# Patient Record
Sex: Female | Born: 1975 | Race: Black or African American | Hispanic: No | Marital: Married | Smoking: Never smoker
Health system: Southern US, Community
[De-identification: ages and names within clinical notes are randomized; demographics above are authoritative.]

## PROBLEM LIST (undated history)

## (undated) DIAGNOSIS — D219 Benign neoplasm of connective and other soft tissue, unspecified: Secondary | ICD-10-CM

## (undated) DIAGNOSIS — N809 Endometriosis, unspecified: Secondary | ICD-10-CM

## (undated) DIAGNOSIS — K56600 Partial intestinal obstruction, unspecified as to cause: Secondary | ICD-10-CM

## (undated) DIAGNOSIS — K9189 Other postprocedural complications and disorders of digestive system: Secondary | ICD-10-CM

## (undated) DIAGNOSIS — K567 Ileus, unspecified: Secondary | ICD-10-CM

## (undated) HISTORY — DX: Benign neoplasm of connective and other soft tissue, unspecified: D21.9

## (undated) HISTORY — DX: Endometriosis, unspecified: N80.9

## (undated) HISTORY — PX: LAPAROSCOPIC ABDOMINAL EXPLORATION: SHX6249

---

## 1998-02-11 ENCOUNTER — Emergency Department (HOSPITAL_COMMUNITY): Admission: EM | Admit: 1998-02-11 | Discharge: 1998-02-11 | Payer: Self-pay | Admitting: Emergency Medicine

## 1998-03-10 ENCOUNTER — Emergency Department (HOSPITAL_COMMUNITY): Admission: EM | Admit: 1998-03-10 | Discharge: 1998-03-10 | Payer: Self-pay | Admitting: Emergency Medicine

## 1999-03-30 ENCOUNTER — Emergency Department (HOSPITAL_COMMUNITY): Admission: EM | Admit: 1999-03-30 | Discharge: 1999-03-30 | Payer: Self-pay | Admitting: Emergency Medicine

## 1999-06-18 ENCOUNTER — Emergency Department (HOSPITAL_COMMUNITY): Admission: EM | Admit: 1999-06-18 | Discharge: 1999-06-18 | Payer: Self-pay

## 1999-12-09 ENCOUNTER — Encounter: Payer: Self-pay | Admitting: Emergency Medicine

## 1999-12-09 ENCOUNTER — Emergency Department (HOSPITAL_COMMUNITY): Admission: EM | Admit: 1999-12-09 | Discharge: 1999-12-09 | Payer: Self-pay | Admitting: Emergency Medicine

## 2001-03-08 ENCOUNTER — Emergency Department (HOSPITAL_COMMUNITY): Admission: EM | Admit: 2001-03-08 | Discharge: 2001-03-08 | Payer: Self-pay | Admitting: Emergency Medicine

## 2001-07-25 ENCOUNTER — Emergency Department (HOSPITAL_COMMUNITY): Admission: EM | Admit: 2001-07-25 | Discharge: 2001-07-25 | Payer: Self-pay | Admitting: Emergency Medicine

## 2001-07-26 ENCOUNTER — Encounter: Payer: Self-pay | Admitting: Emergency Medicine

## 2001-07-26 ENCOUNTER — Emergency Department (HOSPITAL_COMMUNITY): Admission: EM | Admit: 2001-07-26 | Discharge: 2001-07-26 | Payer: Self-pay | Admitting: Emergency Medicine

## 2001-09-29 ENCOUNTER — Emergency Department (HOSPITAL_COMMUNITY): Admission: EM | Admit: 2001-09-29 | Discharge: 2001-09-29 | Payer: Self-pay | Admitting: Emergency Medicine

## 2001-10-11 ENCOUNTER — Encounter: Admission: RE | Admit: 2001-10-11 | Discharge: 2001-11-02 | Payer: Self-pay | Admitting: Occupational Medicine

## 2002-01-12 ENCOUNTER — Emergency Department (HOSPITAL_COMMUNITY): Admission: EM | Admit: 2002-01-12 | Discharge: 2002-01-13 | Payer: Self-pay

## 2002-01-13 ENCOUNTER — Encounter: Payer: Self-pay | Admitting: Emergency Medicine

## 2002-01-18 ENCOUNTER — Encounter: Admission: RE | Admit: 2002-01-18 | Discharge: 2002-01-18 | Payer: Self-pay | Admitting: Internal Medicine

## 2002-01-19 ENCOUNTER — Ambulatory Visit (HOSPITAL_COMMUNITY): Admission: RE | Admit: 2002-01-19 | Discharge: 2002-01-19 | Payer: Self-pay | Admitting: Internal Medicine

## 2002-01-19 ENCOUNTER — Encounter: Payer: Self-pay | Admitting: Internal Medicine

## 2002-01-23 ENCOUNTER — Encounter: Admission: RE | Admit: 2002-01-23 | Discharge: 2002-01-23 | Payer: Self-pay | Admitting: *Deleted

## 2002-01-25 ENCOUNTER — Inpatient Hospital Stay (HOSPITAL_COMMUNITY): Admission: RE | Admit: 2002-01-25 | Discharge: 2002-01-27 | Payer: Self-pay | Admitting: *Deleted

## 2002-01-25 ENCOUNTER — Encounter (INDEPENDENT_AMBULATORY_CARE_PROVIDER_SITE_OTHER): Payer: Self-pay | Admitting: *Deleted

## 2002-01-25 ENCOUNTER — Encounter (INDEPENDENT_AMBULATORY_CARE_PROVIDER_SITE_OTHER): Payer: Self-pay | Admitting: Specialist

## 2002-01-29 ENCOUNTER — Inpatient Hospital Stay (HOSPITAL_COMMUNITY): Admission: AD | Admit: 2002-01-29 | Discharge: 2002-01-29 | Payer: Self-pay | Admitting: Obstetrics and Gynecology

## 2002-01-30 ENCOUNTER — Inpatient Hospital Stay (HOSPITAL_COMMUNITY): Admission: AD | Admit: 2002-01-30 | Discharge: 2002-01-30 | Payer: Self-pay | Admitting: *Deleted

## 2002-01-30 ENCOUNTER — Encounter: Payer: Self-pay | Admitting: *Deleted

## 2002-03-16 ENCOUNTER — Encounter: Admission: RE | Admit: 2002-03-16 | Discharge: 2002-03-16 | Payer: Self-pay | Admitting: *Deleted

## 2002-03-26 ENCOUNTER — Inpatient Hospital Stay (HOSPITAL_COMMUNITY): Admission: AD | Admit: 2002-03-26 | Discharge: 2002-03-26 | Payer: Self-pay | Admitting: *Deleted

## 2002-04-30 ENCOUNTER — Inpatient Hospital Stay (HOSPITAL_COMMUNITY): Admission: AD | Admit: 2002-04-30 | Discharge: 2002-04-30 | Payer: Self-pay | Admitting: *Deleted

## 2002-06-10 ENCOUNTER — Inpatient Hospital Stay (HOSPITAL_COMMUNITY): Admission: AD | Admit: 2002-06-10 | Discharge: 2002-06-10 | Payer: Self-pay | Admitting: *Deleted

## 2004-04-03 ENCOUNTER — Inpatient Hospital Stay (HOSPITAL_COMMUNITY): Admission: AD | Admit: 2004-04-03 | Discharge: 2004-04-03 | Payer: Self-pay | Admitting: Obstetrics

## 2004-06-16 ENCOUNTER — Other Ambulatory Visit: Payer: Self-pay

## 2004-06-16 ENCOUNTER — Emergency Department: Payer: Self-pay | Admitting: Emergency Medicine

## 2004-06-17 ENCOUNTER — Emergency Department: Payer: Self-pay | Admitting: Emergency Medicine

## 2004-06-17 ENCOUNTER — Other Ambulatory Visit: Payer: Self-pay

## 2005-07-19 ENCOUNTER — Ambulatory Visit (HOSPITAL_COMMUNITY): Admission: RE | Admit: 2005-07-19 | Discharge: 2005-07-19 | Payer: Self-pay | Admitting: Obstetrics & Gynecology

## 2005-08-12 ENCOUNTER — Inpatient Hospital Stay (HOSPITAL_COMMUNITY): Admission: AD | Admit: 2005-08-12 | Discharge: 2005-08-18 | Payer: Self-pay | Admitting: Obstetrics

## 2006-01-04 ENCOUNTER — Ambulatory Visit: Admission: RE | Admit: 2006-01-04 | Discharge: 2006-01-04 | Payer: Self-pay | Admitting: Gynecology

## 2006-01-26 ENCOUNTER — Ambulatory Visit (HOSPITAL_COMMUNITY): Admission: RE | Admit: 2006-01-26 | Discharge: 2006-01-26 | Payer: Self-pay | Admitting: Obstetrics & Gynecology

## 2006-03-04 ENCOUNTER — Inpatient Hospital Stay (HOSPITAL_COMMUNITY): Admission: RE | Admit: 2006-03-04 | Discharge: 2006-03-07 | Payer: Self-pay | Admitting: Obstetrics & Gynecology

## 2006-03-04 ENCOUNTER — Encounter (INDEPENDENT_AMBULATORY_CARE_PROVIDER_SITE_OTHER): Payer: Self-pay | Admitting: Specialist

## 2006-03-11 ENCOUNTER — Inpatient Hospital Stay (HOSPITAL_COMMUNITY): Admission: AD | Admit: 2006-03-11 | Discharge: 2006-03-14 | Payer: Self-pay | Admitting: Obstetrics

## 2006-03-30 ENCOUNTER — Inpatient Hospital Stay (HOSPITAL_COMMUNITY): Admission: AD | Admit: 2006-03-30 | Discharge: 2006-04-05 | Payer: Self-pay | Admitting: Obstetrics & Gynecology

## 2006-05-02 ENCOUNTER — Inpatient Hospital Stay (HOSPITAL_COMMUNITY): Admission: AD | Admit: 2006-05-02 | Discharge: 2006-05-02 | Payer: Self-pay | Admitting: Obstetrics & Gynecology

## 2006-08-16 ENCOUNTER — Ambulatory Visit (HOSPITAL_COMMUNITY): Admission: RE | Admit: 2006-08-16 | Discharge: 2006-08-16 | Payer: Self-pay | Admitting: Obstetrics & Gynecology

## 2007-01-16 ENCOUNTER — Ambulatory Visit (HOSPITAL_COMMUNITY): Admission: RE | Admit: 2007-01-16 | Discharge: 2007-01-16 | Payer: Self-pay | Admitting: Obstetrics & Gynecology

## 2007-02-06 IMAGING — CR DG ABDOMEN ACUTE W/ 1V CHEST
3 series · 3 of 3 positions shown · non-contrast
Comparison: 03/12/2006

CLINICAL DATA: Status post appendectomy 03/04/2006. Ileus. Persistent abdominal
pain.

[view not recorded (1 of 3)]
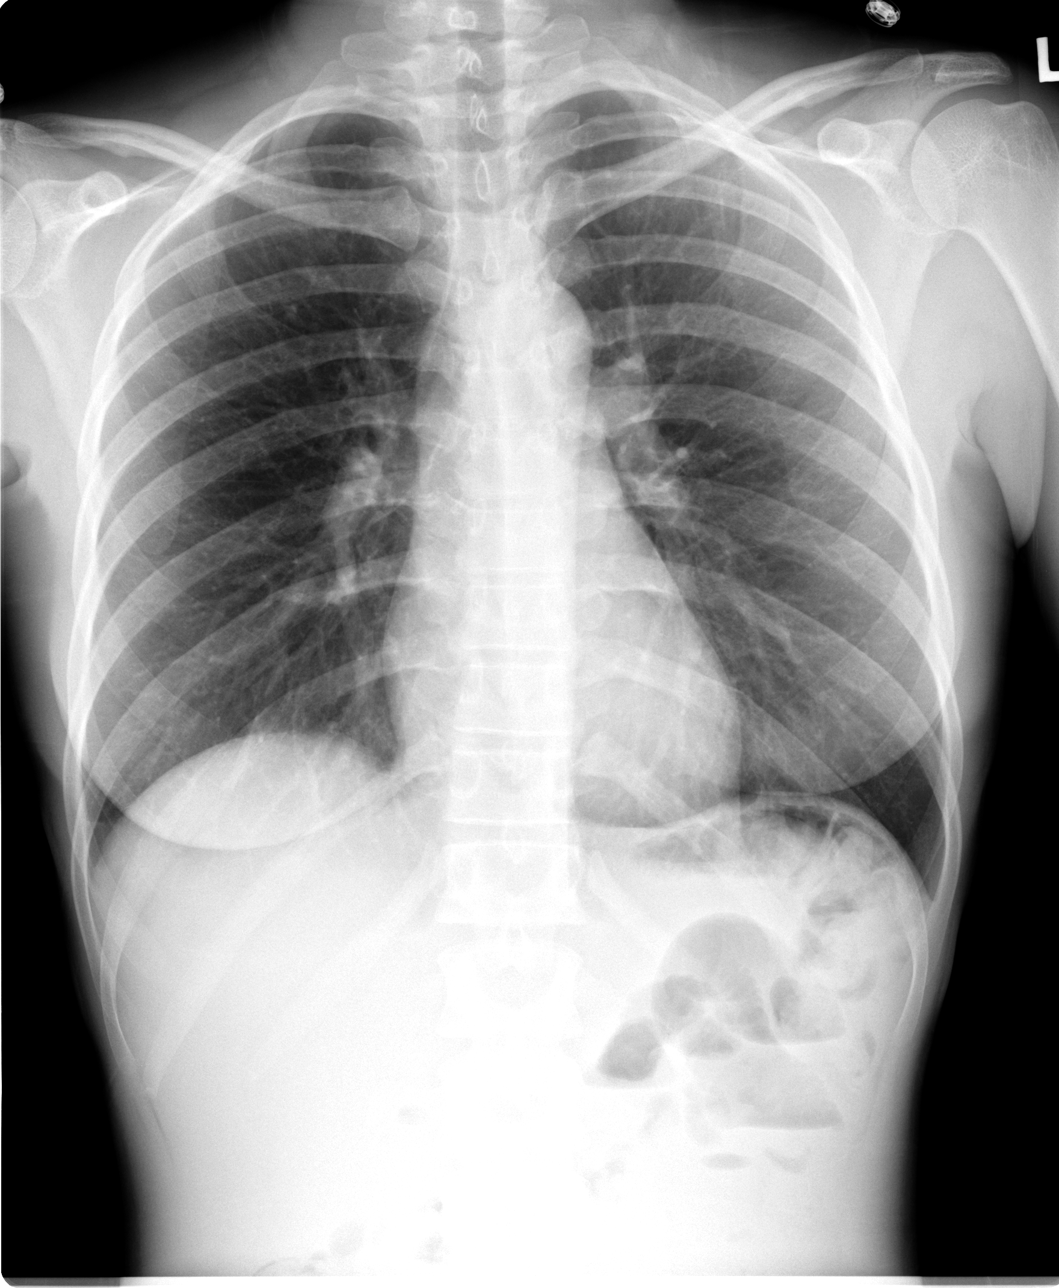

[view not recorded (2 of 3)]
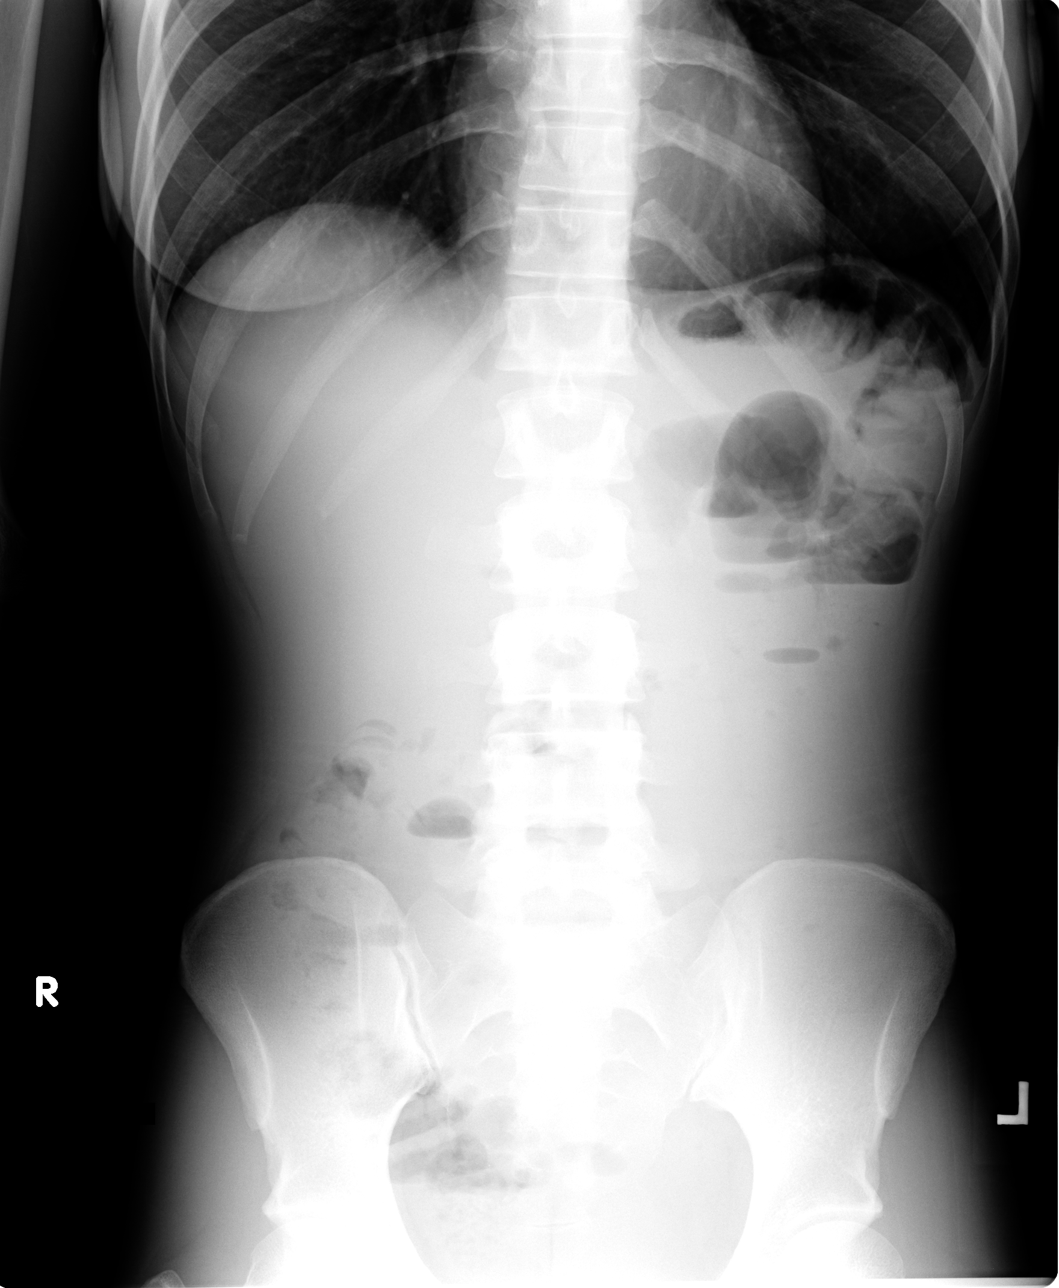

[view not recorded (3 of 3)]
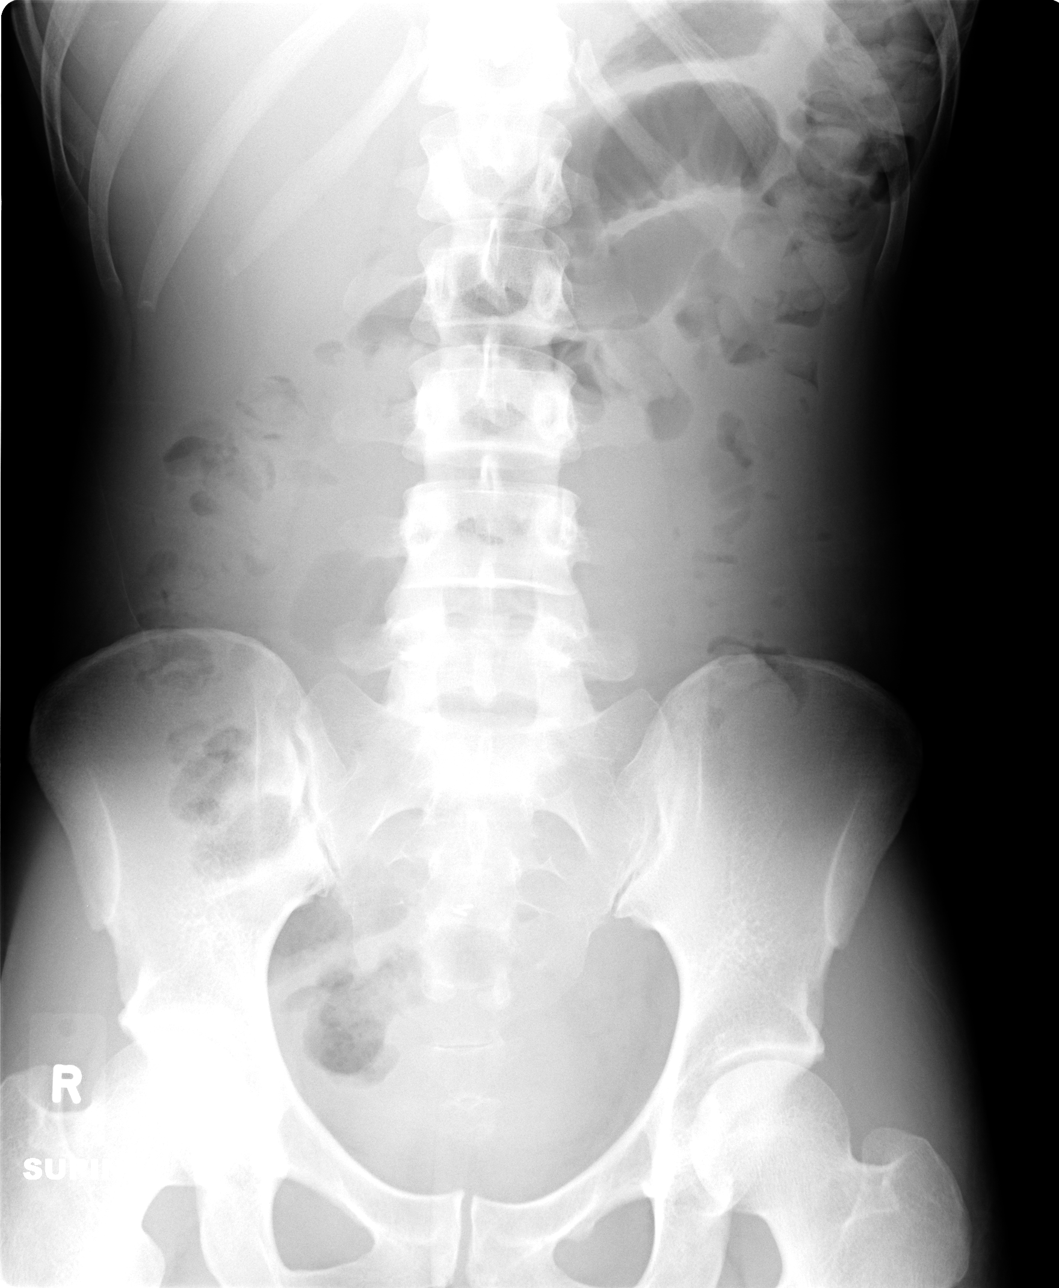

[3 of 3 positions shown; findings below may reference images not displayed]

ABDOMEN SERIES - 2 VIEW & CHEST - 1 VIEW:

Upright chest shows clear lungs bilaterally.  The cardiopericardial silhouette
is within normal limits for size.

No evidence of intraperitoneal free air. Air and stool are scattered along the
course of a nondilated colon. There are some mildly dilated small bowel loops in
the left upper quadrant measuring up to 3.1 cm in diameter with associated
air-fluid levels. Visualized bony structures are unremarkable.
IMPRESSION: No acute cardiopulmonary process.

No intraperitoneal free air. Mild small bowel dilation in the left quadrant with
associated air-fluid levels. There is colonic gas. Features are probably related
to ileus although an early or partial proximal small bowel obstruction cannot be
completely excluded.

## 2007-03-11 IMAGING — CR DG ABDOMEN ACUTE W/ 1V CHEST
3 series · 3 of 3 positions shown · non-contrast
Comparison: none

CLINICAL DATA: Fatigue and diarrhea, with nausea.  History of small bowel obstruction.  Post-operative 03/04/2006. 
 ACUTE ABDOMINAL SERIES:
 PA VIEW CHEST:  
 Heart and mediastinal contours are within normal limits.  The lung fields are clear with no evidence for focal infiltrate or congestive failure.  Bony structures appear intact.  No signs of free intraperitoneal air are noted.

[view not recorded (1 of 3)]
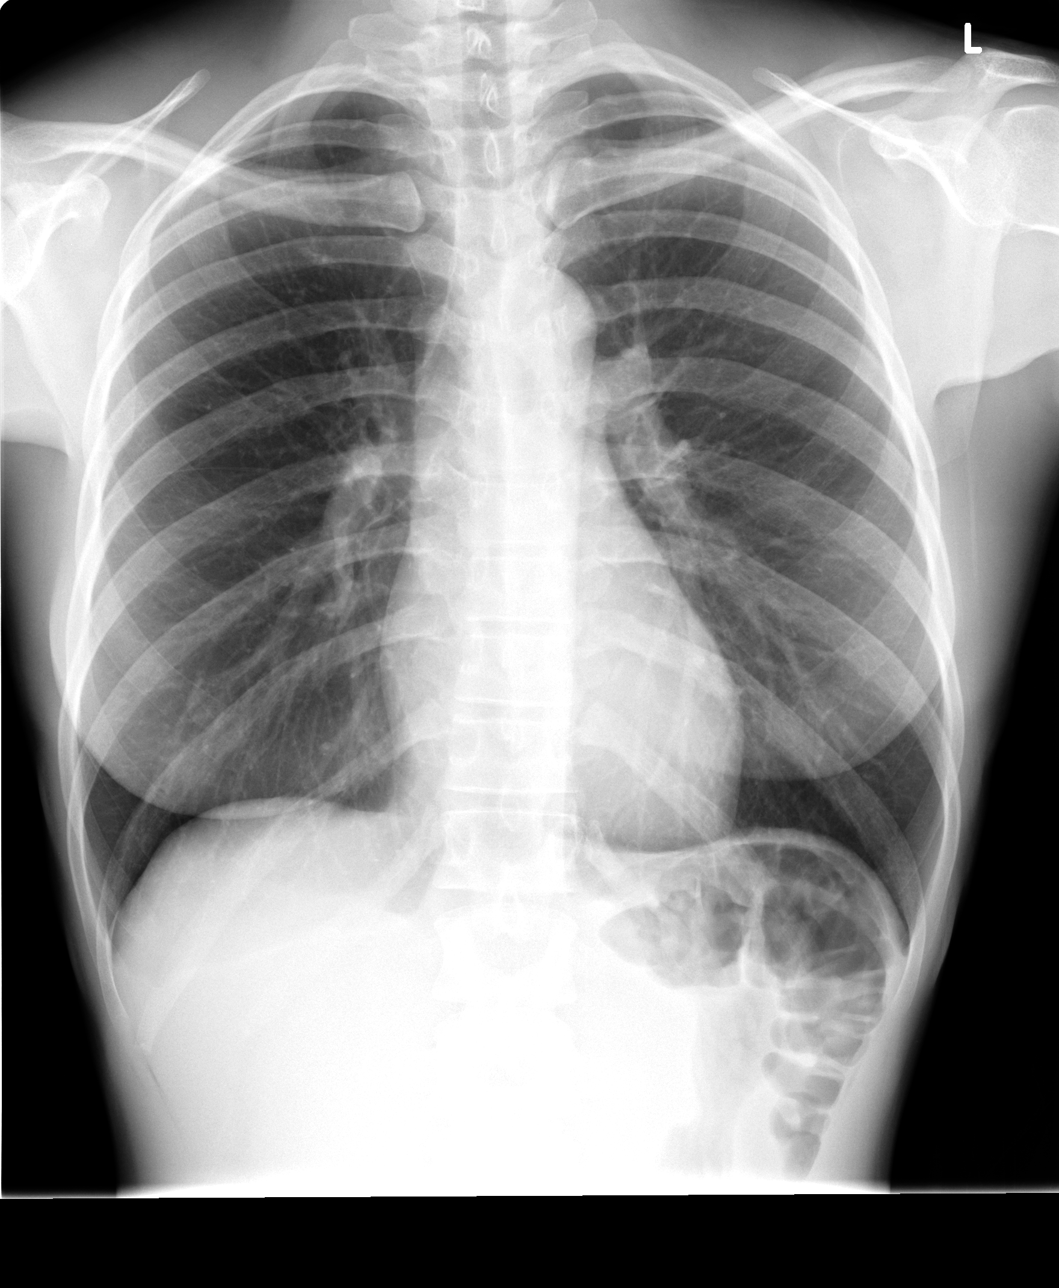

[view not recorded (2 of 3)]
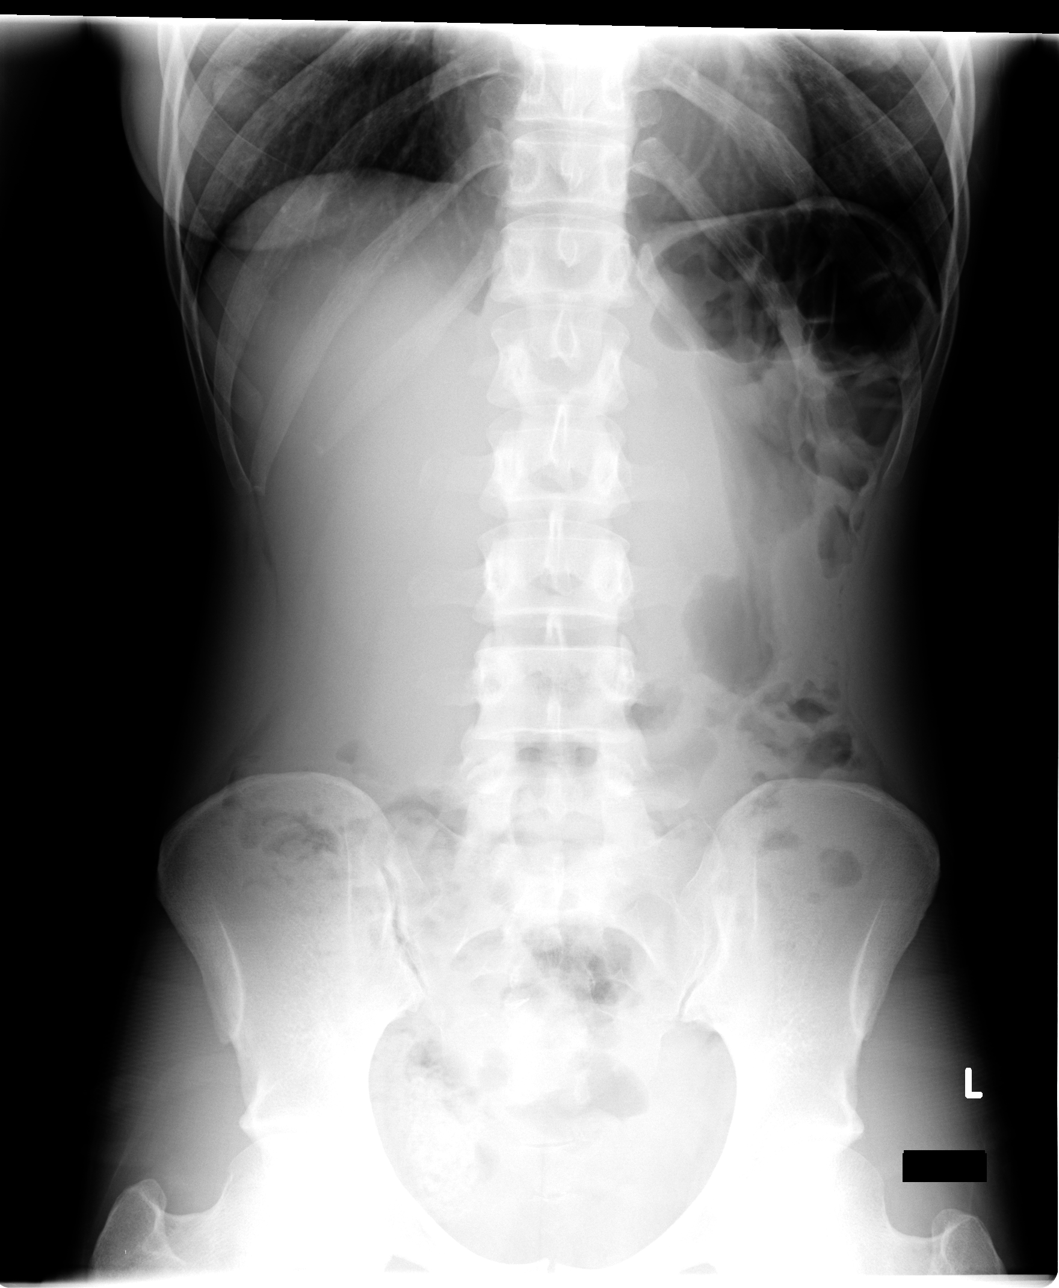

[view not recorded (3 of 3)]
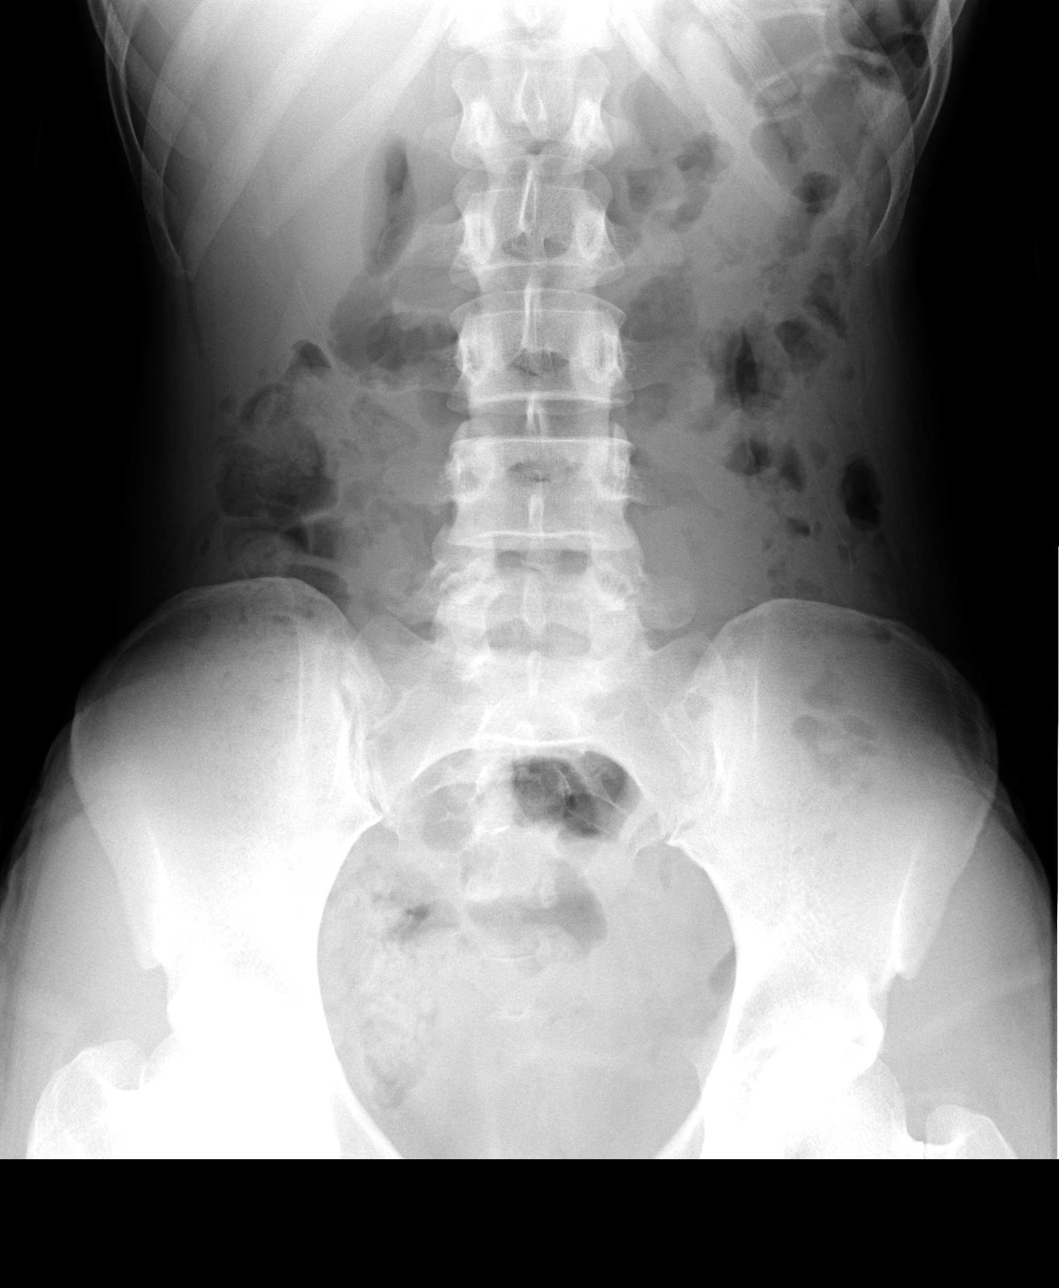

[3 of 3 positions shown; findings below may reference images not displayed]

IMPRESSION: Negative. 
 FLAT UPRIGHT ABDOMEN:   
 No abnormal calcifications are seen.  A normal bowel gas pattern is noted with air identified in the descending portion of the colon.  No air-fluid levels are seen on the upright film to suggest the presence of ileus or obstruction.  The bony structures are intact.
IMPRESSION: No acute abnormality noted.

## 2007-06-09 ENCOUNTER — Ambulatory Visit (HOSPITAL_COMMUNITY): Admission: RE | Admit: 2007-06-09 | Discharge: 2007-06-09 | Payer: Self-pay | Admitting: Unknown Physician Specialty

## 2007-12-27 ENCOUNTER — Ambulatory Visit (HOSPITAL_COMMUNITY): Admission: RE | Admit: 2007-12-27 | Discharge: 2007-12-27 | Payer: Self-pay | Admitting: Obstetrics & Gynecology

## 2008-02-01 ENCOUNTER — Inpatient Hospital Stay (HOSPITAL_COMMUNITY): Admission: AD | Admit: 2008-02-01 | Discharge: 2008-02-01 | Payer: Self-pay | Admitting: Obstetrics & Gynecology

## 2008-03-01 ENCOUNTER — Inpatient Hospital Stay (HOSPITAL_COMMUNITY): Admission: RE | Admit: 2008-03-01 | Discharge: 2008-03-04 | Payer: Self-pay | Admitting: Obstetrics & Gynecology

## 2008-03-01 ENCOUNTER — Encounter: Payer: Self-pay | Admitting: Obstetrics & Gynecology

## 2008-03-15 ENCOUNTER — Inpatient Hospital Stay (HOSPITAL_COMMUNITY): Admission: AD | Admit: 2008-03-15 | Discharge: 2008-03-18 | Payer: Self-pay | Admitting: Obstetrics

## 2008-04-05 ENCOUNTER — Encounter: Admission: RE | Admit: 2008-04-05 | Discharge: 2008-04-30 | Payer: Self-pay | Admitting: Obstetrics & Gynecology

## 2008-05-31 ENCOUNTER — Ambulatory Visit: Payer: Self-pay | Admitting: Gastroenterology

## 2009-01-30 ENCOUNTER — Ambulatory Visit (HOSPITAL_COMMUNITY): Admission: RE | Admit: 2009-01-30 | Discharge: 2009-01-30 | Payer: Self-pay | Admitting: Obstetrics & Gynecology

## 2009-05-20 ENCOUNTER — Inpatient Hospital Stay (HOSPITAL_COMMUNITY): Admission: AD | Admit: 2009-05-20 | Discharge: 2009-05-20 | Payer: Self-pay | Admitting: Obstetrics

## 2010-03-21 ENCOUNTER — Inpatient Hospital Stay (HOSPITAL_COMMUNITY): Admission: AD | Admit: 2010-03-21 | Discharge: 2010-03-21 | Payer: Self-pay | Admitting: Obstetrics & Gynecology

## 2010-07-12 ENCOUNTER — Encounter: Payer: Self-pay | Admitting: Obstetrics & Gynecology

## 2010-07-13 ENCOUNTER — Encounter: Payer: Self-pay | Admitting: Obstetrics & Gynecology

## 2010-09-02 LAB — URINALYSIS, ROUTINE W REFLEX MICROSCOPIC
Bilirubin Urine: NEGATIVE
Glucose, UA: NEGATIVE mg/dL
Ketones, ur: NEGATIVE mg/dL
Leukocytes, UA: NEGATIVE
Protein, ur: NEGATIVE mg/dL
pH: 6 (ref 5.0–8.0)

## 2010-09-02 LAB — POCT PREGNANCY, URINE: Preg Test, Ur: NEGATIVE

## 2010-09-02 LAB — URINE MICROSCOPIC-ADD ON

## 2010-11-03 NOTE — Op Note (Signed)
[name], [name]               ACCOUNT NO.:  [id number]   MEDICAL RECORD NO.:  [id number]          PATIENT TYPE:  INP   LOCATION:  9316                          FACILITY:  [facility]   PHYSICIAN:  Roseanna Rainbow, M.D.DATE OF BIRTH:  Jul 03, 1975   DATE OF PROCEDURE:  DATE OF DISCHARGE:                               OPERATIVE REPORT   PREOPERATIVE DIAGNOSIS:  Endometriosis, rule out right-sided  hydrosalpinx   POSTOPERATIVE DIAGNOSES:  Endometriosis with endometriotic cyst  involving the right broad ligament and posterior cul-de-sac of Douglas,  bowel adhesions.   PROCEDURES:  Exploratory laparotomy, evacuation of the endometriotic  cyst, lysis of adhesions.   SURGEON:  Roseanna Rainbow, MD   ANESTHESIA:  General endotracheal.   ESTIMATED BLOOD LOSS:  100 mL.   COMPLICATIONS:  None.   PATHOLOGY:  Portions of endometriotic cyst wall, fibroid.   PROCEDURES:  The patient was taken to the operating room with an IV  running.  She was given general anesthesia and placed in the dorsal  lithotomy position and prepped and draped in the usual sterile fashion.  After a time-out had been completed, the previous midline scar was then  incised with the scalpel and carried sharply down to the fascia.  The  fascia was incised along the length of the incision.  The  parietoperitoneum was tented up and entered.  This incision was then  extended superiorly and inferiorly.  An O'Connor-O'Sullivan retractor  was then placed into the incision.  The bowel was packed away with moist  laps.  The bladder blade was placed as well.  Long Kelly clamps were  placed on the round ligaments.  The broad ligament on the right side was  incised.  The cyst contents were evacuated with the suction.  An attempt  was made to dissect out the cyst wall; however, the cyst wall was very  friable.  The right tube was inspected and there was no hydrosalpinx  noted.  The caliber was slightly increased in size;  however, there was  no hydrosalpinx apparent.  The distal end was involved in some adhesions  to itself.  These were sharply divided.  There was small, approximately  2 cm of area consistent with ovarian tissue intermittently involving the  fimbriated portion of the tube.  The posterior cul-de-sac was  essentially obliterated.  An attempt was made to dissect the sigmoid  colon from the posterior wall of the uterus and during the dissection, a  second endometriotic cyst was entered involving the pouch of Douglas.  Again, this was evacuated.  There was a scarred plaque densely adherent  to the rectosigmoid.  During this dissection, a small subserosal myoma  approximately a centimeter in diameter was excised using Bovie.  The  other filmy small bowel adhesions to the parietoperitoneum of the pelvis  were divided using sharp and blunt dissection.  Upon inspection, several  loops of small bowel, there were adhesions and of the bowel to itself  and these were filmy and again were divided using sharp and blunt  dissection.  The incision in the broad ligament was reapproximated  in a  running fashion using a suture of 3-0 Monocryl.  The pelvis was  copiously irrigated.  Individual bleeding points were secured with  either figure-of-eight sutures of 3-0 Monocryl or cauterized with the  Bovie.  Seprafilm was placed in the posterior cul-de-sac.  It was also  placed over the surfaces of the of area.  It was generally placed over  the parietoperitoneum of the pelvis.  All the instruments and packing  were then removed from the abdomen.  Seprafilm was also placed under the  incision.  The fascia and parietoperitoneum were closed as a single  layer using 2 sutures of O PDS.  Interrupted subcutaneous sutures were  then placed.  The skin was closed in a subcuticular fashion using 3-0  Monocryl.  At the close of the procedure, the instrument and pack counts  were said to be correct x2.  The patient was taken  to the PACU awake and  in stable condition.      Roseanna Rainbow, M.D.  Electronically Signed     LAJ/MEDQ  D:  03/01/2008  T:  03/02/2008  Job:  [id number]

## 2010-11-03 NOTE — H&P (Signed)
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   HISTORY OF PRESENT ILLNESS:  The patient has had several months history  of right lower quadrant discomfort.  A recent ultrasound on December 27, 2007, demonstrated a fluid-filled structure in the right adnexa that was  likely a right-sided hydrosalpinx.  It also showed two small myomas that  were stable in size.   GYN HISTORY:  Please see the above.  There is a history of a laparotomy  with a left salpingo-oophorectomy, a diagnostic laparoscopy and  exploratory laparotomy with right ovarian cystectomy.  History of mild  cervical dysplasia.   PAST MEDICAL HISTORY:  She denies a past surgical history.  Please see  the above.   ALLERGIES:  MORPHINE.   MEDICATIONS:  Please see the medical reconciliation form.   FAMILY HISTORY:  Noncontributory.   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  She does not smoke.   REVIEW OF SYSTEMS:  GU:  Please see the above.  GI:  Please see the above.   PHYSICAL EXAMINATION:  VITAL SIGNS:  Stable, afebrile.  GENERAL:  A thin African American female, mild distress.  LUNGS:  Clear to auscultation bilaterally.  HEART:  Regular rate and rhythm.  ABDOMEN:  There is mild right lower quadrant tenderness.  No rebound or  guarding.  PELVIC:  The uterus is irregular in contour, upper limits of normal in  size.  Nontender.  The right adnexa is nonpalpable and there is mild  tenderness appreciated.  The left adnexa is nonpalpable.  On speculum  exam, the vagina is  clean.  The cervix is without lesions.   ASSESSMENT:  Right sided adnexal mass, secondary pain.  Ultrasound  findings are suspect for a hydrosalpinx.  Differential diagnosis  includes a hematosalpinx with endometriosis involvement.   PLAN:  The planned procedure is a repeat exploratory laparotomy and left  salpingectomy.  The risks, benefits and alternative forms of management  were reviewed with the patient, including but not limited to the impact  on future fertility.  Informed consent has been obtained.      Roseanna Rainbow, M.D.  Electronically Signed     LAJ/MEDQ  D:  02/29/2008  T:  02/29/2008  Job:  [id number]

## 2010-11-03 NOTE — H&P (Signed)
[name], [name]               ACCOUNT NO.:  [id number]   MEDICAL RECORD NO.:  [id number]          PATIENT TYPE:  OBV   LOCATION:  9303                          [facility]ACILITY:  [facility]   PHYSICIAN:  Roseanna Rainbow, M.D.DATE OF BIRTH:  [birth year]   DATE OF ADMISSION:  03/14/2008  DATE OF DISCHARGE:                              HISTORY & PHYSICAL   CHIEF COMPLAINT:  The patient is a 35 year old status post exploratory  laparotomy on March 01, 2008, for endometriosis, now with nausea and  vomiting and lower abdominal pain.   HISTORY OF PRESENT ILLNESS:  Please see the above.  The patient was  doing well until and she ate her dinner meal.  After the dinner meal,  she has several episodes of vomiting.  She has some lower abdominal pain  incisional in nature related to the vomiting.  There are no other  complaints.  She is moving her bowels.  She denies any recent exposures  or fevers.   PAST GYN HISTORY:  Please see the above.  There is a history of a  laparotomy with left salpingo-oophorectomy, a diagnostic laparoscopy,  and exploratory laparotomy with right ovarian cystectomy.  There is a  history of mild cervical dysplasia.   PAST MEDICAL HISTORY:  Please see the above.   ALLERGIES:  MORPHINE.   MEDICATIONS:  Please see the medication reconciliation form.   FAMILY HISTORY:  Noncontributory.   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   PHYSICAL EXAMINATION:  VITAL SIGNS:  Stable and afebrile.  GENERAL:  Thin African American female, mild distress.  ABDOMEN:  Slightly tympanitic.  Normoactive bowel sounds throughout.  No  rebound or guarding.   LABORATORY DATA:  Complete metabolic profile, normal.  CBC; white blood  cell count of 11.8, hemoglobin 12.6, and platelets 684,000.  Abdominal x-  ray, normal.   ASSESSMENT:  Status post exploratory laparotomy and lysis of adhesions  and evacuation of  endometriotic cyst, now status post an episode of  nausea and vomiting.  I doubt, significant ileus or obstruction.   PLAN:  Observation for now, IV hydration and supportive care.      Roseanna Rainbow, M.D.  Electronically Signed     LAJ/MEDQ  D:  03/14/2008  T:  03/14/2008  Job:  [id number]

## 2010-11-03 NOTE — Discharge Summary (Signed)
NAMELAVONYA, HOERNER               ACCOUNT NO.:  [id number]   MEDICAL RECORD NO.:  [id number]          PATIENT TYPE:  INP   LOCATION:  9316                          [facility]ILITY[facility]   PHYSICIAN:  Roseanna Rainbow, M.D.DATE OF BIRTH:  [birth year]   DATE OF ADMISSION:  03/01/2008  DATE OF DISCHARGE:  03/04/2008                               DISCHARGE SUMMARY   CHIEF COMPLAINT:  The patient is a 35 year old who carries a diagnosis  of endometriosis now with likely right-sided hydrosalpinx presents for  operative management.  Please see the dictated history and physical for  further details.   HOSPITAL COURSE:  The patient was admitted and underwent an exploratory  laparotomy, lysis of adhesions, evacuation of endometriotic cyst.  Please see the dictated operative summary.  Her postoperative course was  uneventful.  She was discharged to home on postoperative day #3  tolerating a regular diet.   DISCHARGE DIAGNOSIS:  Endometriosis involving the pelvic peritoneum.   PROCEDURES:  1. Exploratory laparotomy.  2. Lysis of adhesions.  3. Evacuation of an endometriotic cysts.   CONDITION:  Stable.   DIET:  Regular.   ACTIVITY:  Progressive activity, pelvic rest.   MEDICATIONS:  Resume preoperative medications.   DISPOSITION:  The patient was to follow up in the office in 2 weeks.      Roseanna Rainbow, M.D.  Electronically Signed     LAJ/MEDQ  D:  04/19/2008  T:  04/19/2008  Job:  [id number]

## 2010-11-06 NOTE — H&P (Signed)
NAME:  [name], [name]                      ACCOUNT NO.:  [id number]   MEDICAL RECORD NO.:  [id number]                   PATIENT TYPE:  OUT   LOCATION:  ULT                                  FACILITY:  [facility]   PHYSICIAN:  Enid Cutter, M.D.                  DATE OF BIRTH:  Nov 19, 1975   DATE OF ADMISSION:  01/19/2002  DATE OF DISCHARGE:  01/19/2002                                HISTORY & PHYSICAL   HISTORY OF PRESENT ILLNESS:  The patient is a 35 year old gravida 0 black  female who presents with a history of recent onset of severe back pain.  The  patient states that she has been having worsening menstrual cycles for the  past four years with increasing cramping.  She developed low back pain  approximately three weeks ago and was seen in the emergency department.  At  that time she underwent CT scan and was found to have a 13 cm pelvic mass.  She underwent subsequently ultrasound exam on January 19, 2002 which revealed  a 13 x  9.7 x 7.2 cm mass superior to the uterine fundus that is homogeneous  in nature.  In addition there are two complex cystic lesions in the left  ovary.  The right ovary is not visualized.  The differential diagnosis on  ultrasound was primary considerations would be endometrioma or complex  ovarian cyst with neoplasm.  The patient denies any nausea, vomiting, fever,  or chills.  She states that she does have some pain, particularly in her  left lower quadrant when she has been examined, upon palpation.   PAST MEDICAL HISTORY:  None.   PAST SURGICAL HISTORY:  None.   MEDICATIONS:  Vicodin.   ALLERGIES:  No known drug allergies.   SOCIAL HISTORY:  No tobacco, alcohol, or drug use.   FAMILY HISTORY:  Significant for fibroids, ovarian cyst.   PAST GYNECOLOGICAL HISTORY:  No interval Pap smears.  Last Pap was  approximately one year ago.  No sexually transmitted diseases.  Last  menstrual period was approximately two weeks ago.   PHYSICAL EXAMINATION:   ABDOMEN:  Soft, nondistended, with marked left lower  quadrant pain.  There is a palpable mass in the left lower quadrant.   EXTREMITIES:  Within normal limits.   PELVIC:  EG/BUS is normal.  Vagina is pink and rugated.  Cervix is  nulliparous, without lesions.  Pap smear is obtained.  Bimanual exam reveals  large pelvic mass.  It is quite tender to palpation.  It fills the left  posterior cul-de-sac and left lower quadrant.    ASSESSMENT AND PLAN:  A 35 year old gravida 0 para 0 with large pelvic mass.  I have discussed with the patient the options of surgery versus expectant  management.  It appears that this is most likely an endometrioma and not a  hemorrhagic cyst.  Will have the patient return to my office  at [facility] tomorrow for further evaluation and scheduling of surgery.                                               Enid Cutter, M.D.    EMH/MEDQ  D:  01/23/2002  T:  01/23/2002  Job:  [phone] [id number]   cc:   [facility]

## 2010-11-06 NOTE — Discharge Summary (Signed)
NAMEJOCIE, MERONEY               ACCOUNT NO.:  [id number]   MEDICAL RECORD NO.:  [id number]          PATIENT TYPE:  INP   LOCATION:  9303                          [facility]ACILITY[facility]   PHYSICIAN:  Roseanna Rainbow, M.D.DATE OF BIRTH:  09/01/1975   DATE OF ADMISSION:  03/14/2008  DATE OF DISCHARGE:  03/18/2008                               DISCHARGE SUMMARY   CHIEF COMPLAINT:  The patient is a 35 year old status post an  exploratory laparotomy on March 01, 2008, for endometriosis, now  with nausea, vomiting, and lower abdominal pain.  Please see the  dictated history and physical.   HOSPITAL COURSE:  The patient was admitted.  She was made n.p.o. for  bowel rest.  An initial abdominal x-ray was nonspecific; however, repeat  study on March 15, 2008, was consistent with ileus versus early  partial bowel obstruction.  Her labs remained normal.  She was also  given IV fluids.  Her bowel function gradually returned and she was  discharged to home on March 18, 2008, tolerating a regular diet.   DISCHARGE DIAGNOSIS:  Postoperative ileus.   CONDITION:  Stable.   DIET:  Regular.   ACTIVITY:  Modified bed rest.   DISPOSITION:  The patient was to follow up in the office in 1 week.   MEDICATIONS:  Resume preoperative medications.      Roseanna Rainbow, M.D.  Electronically Signed     LAJ/MEDQ  D:  04/19/2008  T:  04/19/2008  Job:  [id number]

## 2010-11-06 NOTE — Consult Note (Signed)
NAMENEESA, KNAPIK               ACCOUNT NO.:  [id number]   MEDICAL RECORD NO.:  [id number]          PATIENT TYPE:  INP   LOCATION:  9309                          [facility]ACILITY[facility]   PHYSICIAN:  Leonie Man, M.D.   DATE OF BIRTH:  Dec 16, 1975   DATE OF CONSULTATION:  03/30/2006  DATE OF DISCHARGE:                                   CONSULTATION   REFERRING PHYSICIAN:  Roseanna Rainbow, M.D.   PROBLEM:  Partial small bowel obstruction.   HISTORY:  [name] is a 35 year old African American female who is status  post exploratory laparotomy with excision of an endometrioma, appendectomy,  adhesiolysis and right ovarian cystectomy performed on March 04, 2006.  She subsequently did well and was discharged home, but then re-admitted  approximately 2-3 days later with symptoms of small bowel obstruction noted  with nausea, vomiting, and abdominal distension.  She had abdominal sounds  that were concordant with the findings of small bowel obstruction.  Her  symptoms improved on bowel rest and bowel decompression and was again  discharged home on 03/14/2006.  She was admitted again today with symptoms  of abdominal pain, nausea and vomiting.  A CT scan was done today and was  consistent with partial small bowel obstruction with contrast noted to go  completely into the colon but with an area of transition in the region just  to the left of the midline.  There is also noted some areas of fluid  collection within the mesentery that is interpreted either as a  postoperative fluid collection or abscesses.  At the time of today's  evaluation, the patient is currently passing flatus and she is without  abdominal or discomfort.   PAST MEDICAL HISTORY:  The patient is allergic to MORPHINE, which causes  hives.   PAST SURGICAL HISTORY:  In 2003 she underwent exploratory laparotomy for  endometriosis.  In March of 2007 she had a diagnostic laparoscopy and again  in September 2007 she  had an exploratory laparotomy with appendectomy,  ovarian cystectomy, adhesiolysis and resection of an endometrioma.   CURRENT MEDICATIONS:  At home are just Percocet and ibuprofen taken as  needed.   SOCIAL HISTORY:  This is a [place] American female no tobacco nor alcohol  use.  No illicit drug use history.   REVIEW OF SYSTEMS:  Negative in detail except as outlined above in her  present illness and past medical history.   PHYSICAL EXAMINATION:  The patient is a pleasant female in no acute  distress.  Her temperature is 98.4, blood pressure 107/65, respirations 18,  and pulse is 74 and regular.  ABDOMEN:  Examination shows the abdomen to be very softly distended without  any tenderness, mass or visceromegaly.  Bowel sounds on examination are  normoactive currently.   LABORATORY:  WBC 11.5, hemoglobin 9.7, hematocrit 28, platelet count is 410.  Basic metabolic panel shows a sodium of 137, potassium of 3.3, chloride 102,  CO2 28, BUN is 4, serum creatinine is 0.7.  Glucose is 90.  AST and ALT are  16 and 11, respectively.  Alkaline  phosphatase is 52 and total bilirubin is  0.6.  The patient has a calcium of 8.5.   ASSESSMENT:  This is a 35 year old female with two episodes of recurrent  partial small bowel obstruction following a recent surgery.  Given the fact  that she is currently asymptomatic, I will allow another chance for  nonoperative therapy if she resolves completely.  However further recurrence  of her symptoms will require exploration.  Per the examination shows the  patient not to have eaten well in the last several weeks and as a  consequence there is moderate protein calorie malnutrition and it would be  of help to insert a PICC line and start her on [facility] over the next several  days until she is tolerating full diet again.  A repeat KUB is planned for  tomorrow a.m. to document and monitor her progress.      Leonie Man, M.D.  Electronically Signed      PB/MEDQ  D:  03/30/2006  T:  03/31/2006  Job:  [id number]   cc:   Roseanna Rainbow, M.D.  Fax: [phone]

## 2010-11-06 NOTE — Discharge Summary (Signed)
[name], [name]               ACCOUNT NO.:  [id number]   MEDICAL RECORD NO.:  [id number]          PATIENT TYPE:  INP   LOCATION:  9320                          [facility]ILITY[facility]   PHYSICIAN:  Roseanna Rainbow, M.D.DATE OF BIRTH:  31-Dec-1975   DATE OF ADMISSION:  08/12/2005  DATE OF DISCHARGE:  08/18/2005                                 DISCHARGE SUMMARY   CHIEF COMPLAINT:  The patient is a 35 year old African-American female  unable to tolerate oral medications secondary to nausea and vomiting,  undergoing outpatient management of possible pelvic inflammatory disease.   HISTORY OF PRESENT ILLNESS:  Please see the above.  The patient had  presented to the office with worsening pelvic pain.  She has a history of  endometriosis.  An ultrasound was consistent with either a hydrosalpinx or a  hematosalpinx.  In an effort to salvage the tube, an attempt was made to  manage the patient for a possible salpingitis as an outpatient.   ALLERGIES:  MORPHINE.   MEDICATIONS:  Percocet, Flagyl, Phenergan and ofloxacin.   PAST SURGICAL HISTORY:  She is status post an exploratory laparotomy with  unilateral salpingo-oophorectomy.   PAST OBSTETRICAL AND GYNECOLOGICAL HISTORY:  Please see the above.   PHYSICAL EXAMINATION:  VITAL SIGNS:  Stable and afebrile.  GENERAL:  Thin African-American female in no apparent distress.  HEAD, EYES, EARS, NOSE AND THROAT:  Normocephalic, atraumatic.  NECK:  Supple.  LUNGS:  Clear to auscultation.  HEART:  Regular rate and rhythm.  ABDOMEN:  Soft and nontender.  PELVIC EXAM:  Deferred.  EXTREMITIES:  No clubbing, cyanosis or edema.  SKIN:  Without rash.   ASSESSMENT:  Rule out pelvic inflammatory disease versus endometriotic  involvement of the remaining fallopian tube.   PLAN:  Admission, parenteral antibiotic supportive management.  The patient  was admitted and started on broad-spectrum parenteral antibiotics and  antiemetics.  Her pain appeared  to improve transiently.  A CA125 was 24.2.  Her pain worsened somewhat prior to discharge.  However, she was tolerating  a regular diet.  She is discharged to home on February 28.   DISCHARGE DIAGNOSES:  1.  Pelvic pain.  2.  Salpingitis versus hematosalpinx.   CONDITION:  Stable.   DIET:  Regular.   MEDICATIONS:  1.  Resume home medications.  2.  Neurontin 1 tab t.i.d.   DISPOSITION:  The patient was to follow up in the office in 2 weeks.      Roseanna Rainbow, M.D.  Electronically Signed     LAJ/MEDQ  D:  09/09/2005  T:  09/09/2005  Job:  [id number]

## 2010-11-06 NOTE — Discharge Summary (Signed)
NAMEABRIAL, ARRIGHI               ACCOUNT NO.:  [id number]   MEDICAL RECORD NO.:  [id number]          PATIENT TYPE:  INP   LOCATION:  9309                          [facility]  [facility]   PHYSICIAN:  Roseanna Rainbow, M.D.DATE OF BIRTH:  [birth year]   DATE OF ADMISSION:  03/11/2006  DATE OF DISCHARGE:  03/14/2006                                 DISCHARGE SUMMARY   CHIEF COMPLAINT:  The patient is a 35 year old status post an exploratory  laparotomy, ovarian cystectomy with lysis of adhesions, appendectomy for a  right-sided endometrioma, now complaining of nausea, vomiting and abdominal  pain.  Please see the dictated history and physical for further details.   HOSPITAL COURSE:  The patient was admitted.  General surgery was consulted.  The impression was a small-bowel obstruction versus an ileus.  The plan was  a CT scan of the abdomen and pelvis, NG tube bowel decompression, serial  exams and x-rays.  The CT scan was not consistent with a bowel obstruction.  The patient self-discontinued the NG tube on hospital day #1.  The patient's  pain progressively improved.  Her repeat x-ray was much improved.  Her diet  was advanced.  She was discharged to home on September24 tolerating a  regular diet.   DISCHARGE DIAGNOSIS:  Postoperative ileus.   CONDITION:  Stable.   DIET:  Regular.   ACTIVITY:  Pelvic rest, progressive activity.   MEDICATIONS:  1. Percocet.  2. Oxycodone.   DISPOSITION:  The patient was to follow up in the office in several days.      Roseanna Rainbow, M.D.  Electronically Signed     LAJ/MEDQ  D:  04/27/2006  T:  04/27/2006  Job:  [id number]

## 2010-11-06 NOTE — Discharge Summary (Signed)
[name], [name]               ACCOUNT NO.:  [id number]   MEDICAL RECORD NO.:  [id number]          PATIENT TYPE:  INP   LOCATION:  1517                         FACILITY:  [facility]   PHYSICIAN:  Roseanna Rainbow, M.D.DATE OF BIRTH:  January 08, 1976   DATE OF ADMISSION:  03/04/2006  DATE OF DISCHARGE:  03/07/2006                                 DISCHARGE SUMMARY   CHIEF COMPLAINT:  The patient is a 35 year old who presents for lysis of  adhesions and a possible right salpingectomy for endometriosis.   Please see the dictated history and physical as per Dr. Katheren Shams-  Sharol Given for further details.   HOSPITAL COURSE:  The patient was admitted and underwent an exploratory  laparotomy, right ovarian cystectomy, appendectomy, and lysis of adhesions.  Her postoperative course was uneventful.  She was discharged to home on  postoperative day #3, tolerating a regular diet.   DISCHARGE DIAGNOSIS:  Right ovarian endometrioma, extensive pelvic  adhesions.   PROCEDURE:  Exploratory laparotomy, right ovarian cystectomy, lysis of  adhesions, and appendectomy.   CONDITION:  Good.   DIET:  Regular.   ACTIVITY:  Progressive activity, pelvic rest.   MEDICATIONS:  OxyContin, Percocet, ibuprofen, Ambien.   DISPOSITION:  The patient was to follow up in the office in 1-2 weeks.      Roseanna Rainbow, M.D.  Electronically Signed     LAJ/MEDQ  D:  04/26/2006  T:  04/26/2006  Job:  [id number]   cc:   Telford Nab, R.N.  [address]  [place], [place] [address]

## 2010-11-06 NOTE — Consult Note (Signed)
NAMEJALONI, [name]               ACCOUNT NO.:  [id number]   MEDICAL RECORD NO.:  [id number]          PATIENT TYPE:  OUT   LOCATION:  GYN                          FACILITY:  [facility]   PHYSICIAN:  De Blanch, M.D.DATE OF BIRTH:  Dec 01, 1975   DATE OF CONSULTATION:  01/04/2006  DATE OF DISCHARGE:                                   CONSULTATION   CHIEF COMPLAINT:  Pelvic pain.   HISTORY OF PRESENT ILLNESS:  35 year old African-American female seen in  consultation at request of Dr. Antionette Char regarding management of  her right pelvic pain.   The patient has a longstanding history of endometriosis, having undergone a  left salpingo-oophorectomy in August of 2003 for a 14 cm left endometrioma.  She also had endometrioma in the right ovary, undergoing cystectomy as well  as peritoneal implants.  The patient has had continuing chronic right lower  quadrant pain and was further evaluated recently in the winter of this past  year.  Findings on ultrasound of a serpiginous complex fluid collection,  measuring 4.2 x 2.8 x 4.2 cm in the right adnexa suggestive of dilated  fallopian tube, possibly containing old blood.  The ovary was not fully  visualized.   Subsequently, the patient underwent laparoscopy and attempt at removing the  endometriosis by Dr. Odette Fraction at [facility] (October 01, 2005).  Review of his operative note reveals extensive pelvic adhesions  of small and large bowel.  The right adnexa was not visualized and the  surgical procedure was abandoned.   The patient continues to have right chronic pelvic pain and consideration is  being given to laparotomy for lysis of adhesions and right salpingectomy  followed by in vitro fertilization.   The patient has regular cyclic menstrual periods.  She is not using any oral  contraceptives or hormones.  She has previously used Lupron and had severe  hot flushes.   OBSTETRICAL HISTORY:  Gravida  0.   PAST MEDICAL HISTORY:  Medical illnesses:  None.   PAST SURGICAL HISTORY:  Left salpingo-oophorectomy, diagnostic laparoscopy.   DRUG ALLERGIES:  MORPHINE SULFATE CAUSES HIVES.   CURRENT MEDICATIONS:  1.  20 mg OxyContin b.i.d.  2.  Percocet p.r.n. breakthrough pain.  3.  Multivitamins.   FAMILY HISTORY:  The patient has an aunt with ovarian cancer.   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  She is married.  She does not smoke   REVIEW OF SYSTEMS:  10-point comprehensive review of systems negative except  as noted above.   PHYSICAL EXAMINATION:  VITAL SIGNS:  Height 5 feet 6 inches, weight 121  pounds, blood pressure 102/64, pulse 84, respiratory rate 18.  GENERAL:  The patient is a healthy, slender African-American female in no  acute distress.  HEENT:  Negative.  NECK:  Supple without thyromegaly.   There is no supraclavicular or inguinal adenopathy.  ABDOMEN:  Soft, nontender.  She has a well-healed midline incision and a  tattoo in the right lower quadrant.  PELVIC EXAM:  EGBUS, vagina, bladder, urethra  seem normal.  The cervix is  normal.  She is having her menstrual period.  Uterus is anterior, normal  shape, size and consistency.  Right adnexa is tender without a discreet  mass.  EXTREMITIES:  Lower extremities without edema or varicosities.   IMPRESSION:  Endometriosis with extensive adhesions.  The patient is  desirous of having lysis of adhesions and salpingectomy with intent to  preserve the ovary for in vitro fertilization as well as her uterus.   I think this is a reasonable approach and will recommend that this be  completed through a laparotomy.  Given that the patient had a midline  incision, she would like to have the midline incision utilized again.  We  can excise the midline incision and potentially close the skin with  subcuticular closure which may result in a narrower scar.   Prior to surgery, I think  it would be reasonable to obtain a renal  ultrasound to make sure there is no ureteral obstruction secondary to  endometriosis or prior surgery.  At the time of surgery, we will attempt to  use Seprafilm to reduce pelvic adhesions.  The patient wishes to defer  surgery until September.  We will coordinate this with Dr. Tamela Oddi.      De Blanch, M.D.  Electronically Signed     DC/MEDQ  D:  01/04/2006  T:  01/05/2006  Job:  [id number]

## 2010-11-06 NOTE — H&P (Signed)
[name], [name]               ACCOUNT NO.:  [id number]   MEDICAL RECORD NO.:  [id number]          PATIENT TYPE:  INP   LOCATION:  9309                          [facility]ACILITY[facility]  [facility]   PHYSICIAN:  Roseanna Rainbow, M.D.DATE OF BIRTH:  [birth year]   DATE OF ADMISSION:  03/30/2006  DATE OF DISCHARGE:                                HISTORY & PHYSICAL   CHIEF COMPLAINT:  The patient is a 35 year old, status post exploratory  laparotomy, ovarian cystectomy, lysis of adhesions and appendectomy for  right-sided endometrioma on March 04, 2006, now with nausea, vomiting,  and abdominal pain.   HOSPITAL COURSE:  The patient was recently hospitalized with similar  complaints approximately 2 weeks ago.  She was felt to have an ileus at that  point and her bowel function returned subsequent to bowel rest.  She reports  worsening weakness and gas discomfort for several days.  She reports onset  of vomiting yesterday that has the appearance of her p.o. intake.  She also  reports a low-grade fever.  Her last bowel movement was yesterday, normal.  She is passing flatus.   PAST OBSTETRICAL HISTORY:  She has never been pregnant.   PAST GYNECOLOGIC HISTORY:  Please see the above.  She has history of a left  salpingo-oophorectomy and diagnostic laparoscopy.   PAST MEDICAL HISTORY:  She denies.   PAST SURGICAL HISTORY:  Please see the above.   ALLERGIES:  MORPHINE SULFATE CAUSES HIVES.   MEDICATIONS:  Please see the medication reconciliation form.   FAMILY HISTORY:  Noncontributory.   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  She does not smoke.   REVIEW OF SYSTEMS:  GI:  Please see the above.   PHYSICAL EXAMINATION:  VITAL SIGNS:  Stable, afebrile.  Temperature 99.6,  pulse 94, respirations 20, blood pressure 96/58.  GENERAL:  A thin African American female, mild distress.  ABDOMEN:  Normoactive bowel sounds throughout.  Plus/minus diffuse  tenderness.  Plus/minus rebound  tenderness, right lower quadrant.  Nondistended.  The incision is well healed.  PELVIC:  Exam deferred.   LABORATORY WORK:  Urinalysis:  Specific gravity greater than 1.030, large  heme (the patient is menstruating), urine ketones 15, total protein 100.  Potassium 3.4, albumin 3.1.  White blood cell count 13.6, hemoglobin 11.1,  platelets 439,000.   KUB and upright:  Small bowel dilatation in the left quadrant with air fluid  level; colonic gas is present; ileus favored, although early partial small  bowel obstruction cannot be excluded.   ASSESSMENT:  Rule out ileus versus partial small bowel obstruction,  postoperative.   PLAN:  Serial exams, serial labs, small bowel follow-through.  We will  consult with general surgery.      Roseanna Rainbow, M.D.  Electronically Signed     LAJ/MEDQ  D:  03/30/2006  T:  03/30/2006  Job:  [id number]

## 2010-11-06 NOTE — Consult Note (Signed)
NAMEJENISIS, [name]               ACCOUNT NO.:  [id number]   MEDICAL RECORD NO.:  [id number]          PATIENT TYPE:  INP   LOCATION:  9309                          [facility]ACILITY[facility]  [facility]   PHYSICIAN:  Lebron Conners, M.D.   DATE OF BIRTH:  Nov 17, 1975   DATE OF CONSULTATION:  03/11/2006  DATE OF DISCHARGE:  03/14/2006                                   CONSULTATION   CHIEF COMPLAINT:  Vomiting.   HISTORY OF PRESENT ILLNESS:  [name] is a 35 year old black female who is  one week status post exploratory laparotomy, lysis of adhesions, and  appendectomy for endometriosisss.  She has had several procedures for this  in the past.  She had initially done well and had gone home, but for a  couple of days has been vomiting and having abdominal pain.  On evaluation  at the hospital she is found to have a normal white count, distention and  abdominal x-rays suggestive of small bowel obstruction.  She has been  admitted to the hospital for supportive care and close followup.   PAST MEDICAL HISTORY:  General health is good.  The patient is not pregnant.  She has no history of heart and lung disease.  She is on no medicines except  for pain medicine.  No allergies.  No operations except as above.   SOCIAL HISTORY:  She does not smoke, drink or use illicit drugs.   FAMILY HISTORY:  Family history and childhood illnesses unremarkable.   PHYSICAL EXAMINATION:  The vital signs per nurse and are unremarkable.  The  patient is in no acute distress.  Mental status is normal.  HEAD and NECK:  Unremarkable with no enlargement of the thyroid, no masses  in the neck, no icterus. Mucosal moist in the mouth.  CHEST:  Clear to auscultation.  HEART:  Regular rate and rhythm, no murmur or gallop.  ABDOMEN:  No mass or organomegaly.  She is moderately distended and slightly  tender diffusely without specific point tenderness.  There is a recent lower  midline incision which is healing very well. Bowel sounds are  occasional and  slightly high pitched.  The rectal and pelvic are not done.  EXTREMITIES:  No edema, no lesions are seen.  LYMPH NODES:  Not enlarged in her groin or in the axilla.   IMPRESSION:  Small bowel obstruction versus ileus.   RECOMMENDATIONS:  Nasogastric suction, CT scan to rule out abscess or  specific obstructive point, and close followup with x-rays, labs, and  examination.     Lebron Conners, M.D.  Electronically Signed    WB/MEDQ  D:  03/11/2006  T:  03/14/2006  Job:  [id number]

## 2010-11-06 NOTE — Discharge Summary (Signed)
   NAME:  [name], [name]                      ACCOUNT NO.:  [id number]   MEDICAL RECORD NO.:  [id number]                   PATIENT TYPE:  INP   LOCATION:  9323                                 [facility]   PHYSICIAN:  Enid Cutter, M.D.                  DATE OF BIRTH:  [birth year]   DATE OF ADMISSION:  01/25/2002  DATE OF DISCHARGE:  01/27/2002                                 DISCHARGE SUMMARY   REASON FOR ADMISSION:  Adnexal mass.   PRINCIPAL DIAGNOSIS:  A 35 year old gravida 0, para 0 with 13 cm adnexal  mass that is complex.   ADDITIONAL DIAGNOSIS:  Pelvic pain.   HOSPITAL COURSE:  The patient is a 35 year old G-0 who presented to the  [facility] with a history of abdominal pain and right  adnexal mass.  She was found to have an approximately 13 cm complex left  adnexal mass.  She was in quite a bit of pain.  She was scheduled for  exploratory laparotomy and left salpingo-oophorectomy versus cystectomy.  The patient once ex-lap and was found to have large bilateral endometriomas.  The left was quite large and approximately 13 cm in size and a left salpingo-  oophorectomy was performed.  In addition, the patient had a large right  endometrioma which was dissected out and the ovary was over sown.  The  patient tolerated this procedure well and returned to the recovery in stable  condition.  Postoperatively the patient did well and had good pain control.  She was stable on postoperative day #0.  On postoperative day #1 she was  doing well and began to ambulate.  She was afebrile and vital signs were  stable.  She tolerated a regular diet which was begun.  She was begun on  p.o. pain medicines.  On postoperative day #2 the patient was ambulating  very well and tolerating p.o.  Her wound was clean, dry and intact.  The  patient was discharged home in good condition on postoperative day #2.   DIET:  Regular.   ACTIVITY:  No heavy lifting.   FOLLOW UP:  The  patient is to follow up in two to three days for staple  removal.   DISCHARGE MEDICATIONS:  1. Vicodin.  2. Motrin.  3. Colace.  4. Hemocyte.                                               Enid Cutter, M.D.    EMH/MEDQ  D:  02/22/2002  T:  02/22/2002  Job:  [id number]

## 2010-11-06 NOTE — H&P (Signed)
NAMEMALLORIE, NORROD               ACCOUNT NO.:  [id number]   MEDICAL RECORD NO.:  [id number]          PATIENT TYPE:  INP   LOCATION:  9309                          [facility]   PHYSICIAN:  Roseanna Rainbow, M.D.DATE OF BIRTH:  01/23/1976   DATE OF ADMISSION:  03/11/2006  DATE OF DISCHARGE:                                HISTORY & PHYSICAL   CHIEF COMPLAINT:  The patient is a 35 year old status post an exploratory  laparotomy, ovarian cystectomy, lysis of adhesions, appendectomy for a right-  sided endometrioma.  She now c/o N/V/abdominal pain.   HISTORY OF PRESENT ILLNESS:  The surgery was performed approximately 1 week  ago.  The patient presented to the office today with complaints of nausea  and vomiting.  Her last bowel movement was yesterday.  She is also  complaining of abdominal pain.   PAST OB HISTORY:  She has never been pregnant.   PAST GYN HISTORY:  Please see the above.  She has a history of a left  salpingo-oophorectomy and diagnostic laparoscopy.   PAST MEDICAL HISTORY:   MEDICAL ILLNESSES:  None.   PAST SURGICAL HISTORY:  Please see the above.   ALLERGIES:  MORPHINE SULFATE causes hives.   MEDICATIONS:  OxyContin, Percocet.   FAMILY HISTORY:  Aunt with ovarian cancer.   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  She does not smoke.   REVIEW OF SYSTEMS:  GI:  Please see the above.   PHYSICAL EXAMINATION:  VITAL SIGNS:  Stable, afebrile.  GENERAL:  Slender African-American female in moderate acute distress.  ABDOMEN:  Distended, hypoactive bowel sounds.  No focal tenderness.  PELVIC EXAM:  Deferred.   LABORATORY DATA:  White blood cell count 5700, hemoglobin 11, platelets  734,000, CMET normal.  Abdominal x-ray suspicious for a partial small bowel  obstruction.   ASSESSMENT:  Rule out postoperative small bowel obstruction.   PLAN:  1. Admission.  2. Supportive care.  3. CT of abdomen and pelvis.  4. General surgery  consultation.      Roseanna Rainbow, M.D.  Electronically Signed     LAJ/MEDQ  D:  03/11/2006  T:  03/11/2006  Job:  [id number]

## 2010-11-06 NOTE — Discharge Summary (Signed)
[name], [name]               ACCOUNT NO.:  [id number]   MEDICAL RECORD NO.:  [id number]          PATIENT TYPE:  INP   LOCATION:  9309                          [facility]   PHYSICIAN:  Roseanna Rainbow, M.D.DATE OF BIRTH:  06-06-1976   DATE OF ADMISSION:  03/30/2006  DATE OF DISCHARGE:  04/05/2006                                 DISCHARGE SUMMARY   CHIEF COMPLAINT:  The patient is a 35 year old status post exploratory  laparotomy, ovarian cystectomy, lysis of adhesions and appendectomy for  right sided endometrium on September14, now with nausea, vomiting and  abdominal pain.  Please see the dictated history and physical for further  details.   >/HOSPITAL COURSE/>  The patient was admitted.  A nutrition consult was obtained, and it was felt  that she had an inadequate oral intake.  General surgery was consulted, and  Dr. Lurene Shadow felt that she had a recurrent partial small-bowel obstruction.  The plan was to allow another attempt at conservative management with bowel  rest.  He also recommended TNA and serial KUB exams.  The patient's pain  progressively improved.  She continued to pass flatus, and she had good  bowel sounds on exam.  A repeat KUB was consistent with an improved picture  of the small bowel obstruction.  On October11, her basic metabolic  profile was normal and her white blood cell count was 7700, hemoglobin was  9.7.  Her diet was advanced.  The parenteral nutrition was discontinued.  On  October15, it was felt that her p.o. intake had remained poor.  Nutrition  was reconsulted, and the Ensure was increased to three times a day.  She was  then discharged to home on October16.   DISCHARGE DIAGNOSIS:  Recurrent partial small-bowel obstruction  postoperative.   CONDITION ON DISCHARGE:  Stable.   DISCHARGE INSTRUCTIONS:  1. Diet:  Regular with Ensure supplements.  2. Activity:  Progressive activity, pelvic rest.   MEDICATIONS:  1. Dilaudid.  2.  Over-the-counter stool softener.   DISPOSITION:  The patient was to follow up in the office on October19 as  9:00 a.m.      Roseanna Rainbow, M.D.  Electronically Signed     LAJ/MEDQ  D:  04/27/2006  T:  04/27/2006  Job:  [id number]   cc:   Leonie Man, M.D.  [address]  [address]  [place]  [place] [address]

## 2010-11-06 NOTE — Op Note (Signed)
[name], [name]               ACCOUNT NO.:  [id number]   MEDICAL RECORD NO.:  [id number]          PATIENT TYPE:  INP   LOCATION:  1517                         [facility]AC[facility]  [facility]   PHYSICIAN:  Roseanna Rainbow, M.D.DATE OF BIRTH:  07-04-1975   DATE OF PROCEDURE:  03/04/2006  DATE OF DISCHARGE:                                 OPERATIVE REPORT   PREOPERATIVE DIAGNOSES:  Chronic pelvic pain, rule out right-sided  hematosalpinx with endometriosis of the fallopian tube.   POSTOPERATIVE DIAGNOSES:  1. Adhesions, right-sided.  2. Endometrioma of the right ovary.   PROCEDURES:  1. Exploratory laparotomy.  2. Lysis of adhesions.  3. Appendectomy.  4. Right ovarian cystectomy.  5. Revision of previous midline scar.   SURGEONS:  Emmaline Kluver, M.D.  Roseanna Rainbow, M.D.   ANESTHESIA:  General endotracheal.   ESTIMATED BLOOD LOSS:  100 mL.   COMPLICATIONS:  The deserosalization of a loop of small bowel.   PATHOLOGY:  Right ovarian cyst and appendix.   FINDINGS:  Upon entering the peritoneal cavity there was a loop of small  bowel that was adherent to the parietoperitoneum secondary to her previous  midline incision.  There were also adhesions involving the cecum, appendix  and right adnexa.  The right fallopian tube was splayed over the right ovary  which was involved in adhesions to the pelvic sidewall as well as the  posterior wall of the uterus.  There was a chocolate cyst noted involving  the dependent aspect of the right ovary.  There were also small serosal  myomas noted.  There were adhesions involving the sigmoid colon and the  peritoneum of the posterior cul-de-sac.   PROCEDURE:  The patient was taken to the operating room with an IV running.  She was placed in the dorsal lithotomy position, given general anesthesia  and prepped and draped in the usual sterile fashion.  A midline incision was  then made with a scalpel through the previous scar.  The  previous skin scar  was excised with the scalpel.  The incision was carried down to the  underlying fascia.  The fascia was incised along the length of the incision.  The parietoperitoneum was tented up and entered sharply.  The peritoneal  incision was then extended.  The above adhesions were noted.  The adhesions  involving the small bowel and the parietal peritoneum were divided sharply.  The small defect in the serosa was oversewn with interrupted sutures of 2-0  silk.  It was at this point that the appendix was dissected free of the  adhesions.  The appendiceal artery was clamped and divided and suture  ligated.  The appendix was then clamped at the base and excised.  The base  of the appendiceal stump was then suture ligated and a pursestring suture  was then placed into the serosa of the cecum so as to reperitonealize the  appendiceal stump.  The adhesions involving the right ovary were then  divided.  The ovarian capsule overlying the cyst was then incised with the  scalpel.  The cyst was then dissected  from the ovarian cortex and excised.  The ovarian cortex defect was then reapproximated using a running suture of  3-0 PDS.  Adequate hemostasis was noted.  The pelvis was then copiously  irrigated.  Seprafilm was then placed over the suture line of the ovary as  well as raw surfaces of the peritoneum of the pelvic sidewall on the right.  The fascia and the parietoperitoneum were then reapproximated in a running  fashion as a single layer using 0-Vicryl.  The skin was reapproximated in a  subcuticular fashion using a running suture of 3-0 Vicryl.  At the close of  the procedure, the instrument and pack counts were said to be correct x2.  The patient was taken to the PACU awake and in stable condition.      Roseanna Rainbow, M.D.  Electronically Signed     LAJ/MEDQ  D:  03/04/2006  T:  03/07/2006  Job:  [id number]   cc:   Telford Nab, R.N.  [address]   [place], [place] [address]

## 2010-11-06 NOTE — Op Note (Signed)
NAME:  [name], [name]                      ACCOUNT NO.:  [id number]   MEDICAL RECORD NO.:  [id number]                   PATIENT TYPE:  INP   LOCATION:  9323                                 FACILITY:  [facility]   PHYSICIAN:  Enid Cutter, M.D.                  DATE OF BIRTH:  Nov 07, 1975   DATE OF PROCEDURE:  01/25/2002  DATE OF DISCHARGE:                                 OPERATIVE REPORT   PREOPERATIVE DIAGNOSES:  A 35 year old gravida 0, para 0 black female with  pelvic masses consistent with endometrioma.   POSTOPERATIVE DIAGNOSES:  Left 14 cm endometrioma, right ovarian  endometrioma x2 at approximately 3 cm in size, dense adhesions to the left  pelvic mass to the uterine fundus and left pelvic side wall, peritoneal  endometrioids implants.   PROCEDURE:  Exploratory laparotomy, left salpingo-oophorectomy, lysis of  dense adhesions, right ovarian cystectomy x2, and removal of peritoneal  endometriosis implants.   SURGEON:  Enid Cutter, M.D.   ASSISTANTMichele Mcalpine D. Okey Dupre, M.D.   ANESTHESIA:  General endotracheal.   COMPLICATIONS:  None.   SPECIMENS:  Left tube and ovary, right ovarian cyst, and peritoneal  endometriosis implants.   DISPOSITION:  Recovery room.  Sponge, instrument, and needle counts were  correct x3.   INDICATIONS FOR PROCEDURE:  The patient is a 35 year old gravida 0, para 0  black female who presented to the clinic January 23, 2002.  She had undergone  CT scan secondary to left pelvic pain.  On CT scan she was found to have a  large ovarian mass.  She underwent subsequent ultrasound which revealed a 13  cm left adnexal mass that was consistent with endometriomas.  She also was  found to have two separate right ovarian cysts as well.  The patient was  counseled on indications for surgery and counseled on the risks of surgery  including bleeding, infection, injury to internal organs, risk of  transfusion.  The patient was consented for exploratory laparotomy  and left  salpingo-oophorectomy as well as indicated procedures.   PROCEDURE:  The patient was taken to the operating room.  She was prepped  under general endotracheal anesthesia.  She was prepped and draped in a  sterile fashion.  A midline vertical incision was performed and carried up  approximately 6-8 cm from the symphysis.  The fascia was then entered with  the Bovie and the rectus muscle bellies were separated.  The underlying  peritoneum was grasped and entered sharply.  The peritoneum was resected  both superiorly and inferiorly.  The survey of anatomy revealed a pelvic  mass that appeared to be coming from the left tube and ovary.  There were  dense adhesions of the mass and after some dissection it was apparent we  were going to be unable to proceed without extension of the incision.  The  incision was extended to just below the umbilicus.  A Balfour retractor was  placed in the abdominal incision and the pericolic gutters were packed with  lap sponges.  The left pelvic mass was dissected from the dense adhesions on  the left pelvic side wall as well as the posterior cul-de-sac.  Once these  adhesions had been lysed, the mass was able to be freed to level of the  pedicle on the left side.  The broad ligament was opened and the left ureter  was identified and __________  on the operative field.  The patient's left  infundibulopelvic vessels were identified and clamped and ligated with a 0  Vicryl suture.  The dissection of the mass continued to the point of  remaining pedicle adherent to the anterior uterus.  The ureter was  visualized and noted to be out of the operative field.  The remaining  pedicle was clamped across including the left tube and ovary.  This was  transected and ligated with three sutures at the uterine cornu in Heaney  fashion.  The mass had ruptured during this process and chocolate fluid was  released.  A copious amount of irrigation was  used removed this  fluid from  the columns.  This was handed off to pathology.  There was an endometrial  implant on the anterior abdominal wall peritoneum.  This was easily excised  with Bovie.  The patient's right tube and ovary were inspected and the right  tube appeared normal.  The right ovary had palpable cystic structures.  The  antimesenteric portion of the ovary was incised with the scalpel and an  approximately 3 cm endometrioma was removed after dissection with Hemostats.  Dissection for the second endometrioma was attempted and this ruptured  during dissection.  The cyst wall was completely excised.  The ovarian  defect was then closed with a running suture of 3-0 Vicryl.  This closed the  dead space.  A running locking stitch was used to obtain final closure of  the ovarian cortex.  The ovary was hemostatic.  A copious amount of  irrigation again was used to irrigate the pelvis.  The posterior cul-de-sac  was inspected and there was some obliteration of the left posterior cul-de-  sac with the sigmoid colon being densely adhered to the uterus.  The  incision was placed again at the uterine cornu at the place of excision of  the pelvic mass to obtain final hemostasis.  There was some small oozing  from the peritoneal edges and this was cauterized with Bovie.  INTERCEED was  placed around the right ovary to decrease pelvic adhesions and after  obtained hemostasis.  The bowel packs and retractor were removed.  The  fascia was closed with a running stitch of 0 Vicryl.  The subcutaneous  tissues were irrigated and the skin edges were reapproximated with staples.  The patient tolerated the procedure well and returned to recovery room in  stable condition.                                               Enid Cutter, M.D.    EMH/MEDQ  D:  01/25/2002  T:  01/27/2002  Job:  [id number]

## 2011-03-19 LAB — POCT PREGNANCY, URINE: Preg Test, Ur: NEGATIVE

## 2011-03-22 LAB — URINE MICROSCOPIC-ADD ON

## 2011-03-22 LAB — COMPREHENSIVE METABOLIC PANEL
ALT: 15
AST: 18
Albumin: 3.9
Alkaline Phosphatase: 49
BUN: 11
CO2: 27
Calcium: 10.1
Chloride: 105
Creatinine, Ser: 0.72
GFR calc Af Amer: >60
GFR calc non Af Amer: >60
Glucose, Bld: 120 — ABNORMAL HIGH
Potassium: 3.7
Sodium: 139
Total Bilirubin: 0.3
Total Protein: 7.2

## 2011-03-22 LAB — BASIC METABOLIC PANEL WITH GFR
BUN: 12
BUN: 5 — ABNORMAL LOW
CO2: 26
CO2: 28
Calcium: 8.9
Calcium: 9.5
Chloride: 107
Chloride: 110
Creatinine, Ser: 0.69
Creatinine, Ser: 0.72
GFR calc non Af Amer: >60
GFR calc non Af Amer: >60
Glucose, Bld: 101 — ABNORMAL HIGH
Glucose, Bld: 109 — ABNORMAL HIGH
Potassium: 3.8
Potassium: 3.9
Sodium: 139
Sodium: 142

## 2011-03-22 LAB — URINALYSIS, ROUTINE W REFLEX MICROSCOPIC
Bilirubin Urine: NEGATIVE
Glucose, UA: NEGATIVE
Ketones, ur: >80 — AB
Leukocytes, UA: NEGATIVE
Nitrite: NEGATIVE
Protein, ur: 100 — AB
Specific Gravity, Urine: 1.015
Urobilinogen, UA: 0.2
pH: 6.5

## 2011-03-22 LAB — CBC
HCT: 28 — ABNORMAL LOW
HCT: 34 — ABNORMAL LOW
HCT: 38.2
Hemoglobin: 11.4 — ABNORMAL LOW
Hemoglobin: 12.6
Hemoglobin: 9.4 — ABNORMAL LOW
MCHC: 33
MCHC: 33.6
MCHC: 33.6
MCV: 92.9
MCV: 93.1
MCV: 93.5
Platelets: 564 — ABNORMAL HIGH
Platelets: 684 — ABNORMAL HIGH
RBC: 3.66 — ABNORMAL LOW
RBC: 4.1
RDW: 12.4
RDW: 12.5
RDW: 12.5
WBC: 11.8 — ABNORMAL HIGH
WBC: 9.1

## 2011-03-22 LAB — LIPASE, BLOOD: Lipase: 31

## 2011-03-22 LAB — AMYLASE: Amylase: 135 — ABNORMAL HIGH

## 2011-03-24 LAB — CBC
Hemoglobin: 12.2
MCHC: 33.2
MCV: 93.5
Platelets: 311
RDW: 13
WBC: 11.6 — ABNORMAL HIGH

## 2012-01-05 ENCOUNTER — Other Ambulatory Visit: Payer: Self-pay | Admitting: Obstetrics & Gynecology

## 2012-01-05 DIAGNOSIS — N809 Endometriosis, unspecified: Secondary | ICD-10-CM

## 2012-01-05 DIAGNOSIS — R1900 Intra-abdominal and pelvic swelling, mass and lump, unspecified site: Secondary | ICD-10-CM

## 2012-01-10 ENCOUNTER — Other Ambulatory Visit: Payer: Self-pay

## 2012-01-14 ENCOUNTER — Other Ambulatory Visit: Payer: Self-pay

## 2012-01-14 ENCOUNTER — Inpatient Hospital Stay: Admission: RE | Admit: 2012-01-14 | Payer: Self-pay | Source: Ambulatory Visit

## 2012-06-02 ENCOUNTER — Other Ambulatory Visit: Payer: Self-pay | Admitting: Physician Assistant

## 2012-06-02 DIAGNOSIS — Z1231 Encounter for screening mammogram for malignant neoplasm of breast: Secondary | ICD-10-CM

## 2012-07-07 ENCOUNTER — Ambulatory Visit: Payer: Self-pay

## 2012-10-09 ENCOUNTER — Telehealth: Payer: Self-pay | Admitting: *Deleted

## 2012-10-09 NOTE — Telephone Encounter (Signed)
Pt states she [place] currently taking the Femara and Aygestin that has been prescribed. Pt states she has been having pain and passing clots x 2 weeks. Pt states it happens on and off. Pt states it happens for 1-2 das then stops and happens again for 1-2 more days. Spoke with Dr. Clearance Coots and advised patient to go to [facility].   Pt refused to go to [facility]. Pt states she would like an ultrasound before her appointment here in the office.

## 2012-10-09 NOTE — Telephone Encounter (Signed)
Pt called requesting an emergency appointment. Pt states she is having severe pain with blood clots.

## 2012-10-10 NOTE — Telephone Encounter (Signed)
Needs appointment

## 2012-10-24 NOTE — Telephone Encounter (Signed)
Pt has appointment scheduled for 10-25-12

## 2012-10-25 ENCOUNTER — Encounter: Payer: Self-pay | Admitting: Obstetrics & Gynecology

## 2012-10-25 ENCOUNTER — Other Ambulatory Visit: Payer: Self-pay | Admitting: Obstetrics & Gynecology

## 2012-10-25 ENCOUNTER — Ambulatory Visit (INDEPENDENT_AMBULATORY_CARE_PROVIDER_SITE_OTHER): Payer: BC Managed Care – PPO | Admitting: Obstetrics & Gynecology

## 2012-10-25 ENCOUNTER — Other Ambulatory Visit (INDEPENDENT_AMBULATORY_CARE_PROVIDER_SITE_OTHER): Payer: BC Managed Care – PPO

## 2012-10-25 VITALS — BP 101/66 | HR 89 | Temp 98.1°F | Ht 66.0 in | Wt 134.0 lb

## 2012-10-25 DIAGNOSIS — N809 Endometriosis, unspecified: Secondary | ICD-10-CM

## 2012-10-25 DIAGNOSIS — R1031 Right lower quadrant pain: Secondary | ICD-10-CM

## 2012-10-25 MED ORDER — TRAMADOL HCL ER 200 MG PO TB24: 200.0000 mg | ORAL_TABLET | Freq: Every day | ORAL | Status: DC

## 2012-10-25 NOTE — Patient Instructions (Signed)
Gabapentin capsules or tablets What is this medicine? GABAPENTIN (GA ba pen tin) is used to control partial seizures in adults with epilepsy. It is also used to treat certain types of nerve pain. This medicine may be used for other purposes; ask your health care provider or pharmacist if you have questions. What should I tell my health care provider before I take this medicine? They need to know if you have any of these conditions: -kidney disease -suicidal thoughts, plans, or attempt; a previous suicide attempt by you or a family member -an unusual or allergic reaction to gabapentin, other medicines, foods, dyes, or preservatives -pregnant or trying to get pregnant -breast-feeding How should I use this medicine? Take this medicine by mouth. Swallow it with a drink of water. Follow the directions on the prescription label. If this medicine upsets your stomach, take it with food or milk. Take your medicine at regular intervals. Do not take it more often than directed. If you are directed to break the 600 or 800 mg tablets in half as part of your dose, the extra half tablet should be used for the next dose. If you have not used the extra half tablet within 3 days, it should be thrown away. A special MedGuide will be given to you by the pharmacist with each prescription and refill. Be sure to read this information carefully each time. Talk to your pediatrician regarding the use of this medicine in children. Special care may be needed. Overdosage: If you think you have taken too much of this medicine contact a poison control center or emergency room at once. NOTE: This medicine is only for you. Do not share this medicine with others. What if I miss a dose? If you miss a dose, take it as soon as you can. If it is almost time for your next dose, take only that dose. Do not take double or extra doses. What may interact with this medicine? -antacids -hydrocodone -morphine -naproxen -sevelamer This  list may not describe all possible interactions. Give your health care provider a list of all the medicines, herbs, non-prescription drugs, or dietary supplements you use. Also tell them if you smoke, drink alcohol, or use illegal drugs. Some items may interact with your medicine. What should I watch for while using this medicine? Visit your doctor or health care professional for regular checks on your progress. You may want to keep a record at home of how you feel your condition is responding to treatment. You may want to share this information with your doctor or health care professional at each visit. You should contact your doctor or health care professional if your seizures get worse or if you have any new types of seizures. Do not stop taking this medicine or any of your seizure medicines unless instructed by your doctor or health care professional. Stopping your medicine suddenly can increase your seizures or their severity. Wear a medical identification bracelet or chain if you are taking this medicine for seizures, and carry a card that lists all your medications. You may get drowsy, dizzy, or have blurred vision. Do not drive, use machinery, or do anything that needs mental alertness until you know how this medicine affects you. To reduce dizzy or fainting spells, do not sit or stand up quickly, especially if you are an older patient. Alcohol can increase drowsiness and dizziness. Avoid alcoholic drinks. Your mouth may get dry. Chewing sugarless gum or sucking hard candy, and drinking plenty of water will help.   The use of this medicine may increase the chance of suicidal thoughts or actions. Pay special attention to how you are responding while on this medicine. Any worsening of mood, or thoughts of suicide or dying should be reported to your health care professional right away. Women who become pregnant while using this medicine may enroll in the [place] Antiepileptic Drug Pregnancy Registry  by calling 1-888-233-2334. This registry collects information about the safety of antiepileptic drug use during pregnancy. What side effects may I notice from receiving this medicine? Side effects that you should report to your doctor or health care professional as soon as possible: -allergic reactions like skin rash, itching or hives, swelling of the face, lips, or tongue -worsening of mood, thoughts or actions of suicide or dying Side effects that usually do not require medical attention (report to your doctor or health care professional if they continue or are bothersome): -constipation -difficulty walking or controlling muscle movements -nausea -slurred speech -tremors -weight gain This list may not describe all possible side effects. Call your doctor for medical advice about side effects. You may report side effects to FDA at 1-800-FDA-1088. Where should I keep my medicine? Keep out of reach of children. Store at room temperature between 15 and 30 degrees C (59 and 86 degrees F). Throw away any unused medicine after the expiration date. NOTE: This sheet is a summary. It may not cover all possible information. If you have questions about this medicine, talk to your doctor, pharmacist, or health care provider.  2012, [employer]vier/Gold Standard. (02/03/2010 6:06:26 PM) 

## 2012-10-25 NOTE — Progress Notes (Signed)
Subjective:     [name] is a 37 y.o. female here for a problem visit.  Current complaints: She is currently on Femara and Aygestin and not having monthly cycles.  She says that she has been passing large clots this month with severe cramps - no bleeding.  Personal health questionnaire reviewed: no.   Gynecologic History No LMP recorded. Patient is not currently having periods (Reason: Other).  Last Pap: 10/2011  Results were: normal Last mammogram: N/A  Obstetric History OB History   Grav Para Term Preterm Abortions TAB SAB Ect Mult Living                   The following portions of the patient's history were reviewed and updated as appropriate: allergies, current medications, past family history, past medical history, past social history, past surgical history and problem list.  Review of Systems Pertinent items are noted in HPI.    Objective:     Abd: NT     Assessment:   Endometriosis Pelvic pain  Plan:   Trial of extended-release Ultram  May need to add neuromodulator or SSRI

## 2012-10-26 ENCOUNTER — Encounter: Payer: Self-pay | Admitting: Obstetrics & Gynecology

## 2012-10-26 MED ORDER — GABAPENTIN (ONCE-DAILY) 300 MG PO TABS: 1.0000 | ORAL_TABLET | Freq: Every day | ORAL | Status: DC

## 2012-10-26 MED ORDER — OXYCODONE-ACETAMINOPHEN 10-325 MG PO TABS: 1.0000 | ORAL_TABLET | Freq: Three times a day (TID) | ORAL | Status: DC | PRN

## 2012-10-26 NOTE — Addendum Note (Signed)
Addended by: Antionette Char on: 10/26/2012 04:21 PM   Modules accepted: Orders, Medications

## 2012-10-27 ENCOUNTER — Telehealth: Payer: Self-pay | Admitting: *Deleted

## 2012-10-27 NOTE — Telephone Encounter (Signed)
Patient was seen in office 10/26/2012.

## 2012-11-13 ENCOUNTER — Encounter: Payer: Self-pay | Admitting: Obstetrics & Gynecology

## 2012-11-19 ENCOUNTER — Other Ambulatory Visit: Payer: Self-pay | Admitting: Obstetrics & Gynecology

## 2012-11-20 ENCOUNTER — Encounter: Payer: Self-pay | Admitting: Obstetrics & Gynecology

## 2012-11-21 NOTE — Telephone Encounter (Signed)
Please respond

## 2012-11-28 NOTE — Telephone Encounter (Signed)
Call placed to [facility]. Pharmacist states rx was received yesterday 11/27/2012 for Letrazole for  qty 30 4 refills. Pharmacist states patient refilled Rx on 5/24 and is not due to refill until 12/12/2012. Call placed to patient at [phone]; patient made aware of rx refills on file. Patient verbalized understanding. No fruther action is required.

## 2012-12-07 ENCOUNTER — Ambulatory Visit: Payer: BC Managed Care – PPO | Admitting: Obstetrics & Gynecology

## 2012-12-28 ENCOUNTER — Other Ambulatory Visit: Payer: Self-pay | Admitting: Obstetrics & Gynecology

## 2013-01-08 ENCOUNTER — Encounter: Payer: Self-pay | Admitting: Obstetrics & Gynecology

## 2013-01-23 ENCOUNTER — Encounter: Payer: Self-pay | Admitting: Obstetrics & Gynecology

## 2013-01-25 ENCOUNTER — Other Ambulatory Visit: Payer: Self-pay | Admitting: Obstetrics & Gynecology

## 2013-01-25 ENCOUNTER — Encounter: Payer: Self-pay | Admitting: Obstetrics & Gynecology

## 2013-01-26 NOTE — Telephone Encounter (Signed)
Please call pt.  Rx cannot be escribed

## 2013-02-07 ENCOUNTER — Other Ambulatory Visit: Payer: Self-pay | Admitting: Obstetrics & Gynecology

## 2013-03-08 ENCOUNTER — Encounter: Payer: Self-pay | Admitting: Obstetrics & Gynecology

## 2013-03-31 ENCOUNTER — Other Ambulatory Visit: Payer: Self-pay | Admitting: Obstetrics & Gynecology

## 2013-04-02 ENCOUNTER — Ambulatory Visit (INDEPENDENT_AMBULATORY_CARE_PROVIDER_SITE_OTHER): Payer: BC Managed Care – PPO | Admitting: Obstetrics & Gynecology

## 2013-04-02 ENCOUNTER — Encounter: Payer: Self-pay | Admitting: Obstetrics & Gynecology

## 2013-04-02 VITALS — BP 132/83 | HR 84 | Temp 97.0°F | Ht 66.0 in | Wt 135.0 lb

## 2013-04-02 DIAGNOSIS — N926 Irregular menstruation, unspecified: Secondary | ICD-10-CM

## 2013-04-02 DIAGNOSIS — N809 Endometriosis, unspecified: Secondary | ICD-10-CM

## 2013-04-02 LAB — POCT URINE PREGNANCY: Preg Test, Ur: NEGATIVE

## 2013-04-02 MED ORDER — NORETHINDRONE ACETATE 5 MG PO TABS: 5.0000 mg | ORAL_TABLET | Freq: Two times a day (BID) | ORAL | Status: DC

## 2013-04-02 MED ORDER — PROMETHAZINE HCL 12.5 MG PO TABS: 25.0000 mg | ORAL_TABLET | Freq: Three times a day (TID) | ORAL | Status: DC | PRN

## 2013-04-02 MED ORDER — AMITRIPTYLINE HCL 25 MG PO TABS: 50.0000 mg | ORAL_TABLET | Freq: Every day | ORAL | Status: DC

## 2013-04-02 MED ORDER — OXYCODONE-ACETAMINOPHEN 10-325 MG PO TABS: 1.0000 | ORAL_TABLET | Freq: Three times a day (TID) | ORAL | Status: DC | PRN

## 2013-04-02 NOTE — Progress Notes (Signed)
Subjective:     [name] is a 37 y.o. female here for a routine exam.  Current complaints: pain for about 2 weeks. Pt states she is on the Aygestin and Femara. Pt states she started passing golf ball sized clots. Pt states she started with nausea and vomiting about 4 days ago.  Personal health questionnaire reviewed: yes.   Gynecologic History No LMP recorded. Patient is not currently having periods (Reason: Other). Contraception: none   Obstetric History OB History  No data available     The following portions of the patient's history were reviewed and updated as appropriate: allergies, current medications, past family history, past medical history, past social history, past surgical history and problem list.  Review of Systems Pertinent items are noted in HPI.    Objective:     No exam today     Assessment:    Endometriosis, symptomatic  Plan:    Continue Femara/Aygestin Trial of TCA Return prn or in a few months

## 2013-04-08 ENCOUNTER — Other Ambulatory Visit: Payer: Self-pay | Admitting: Obstetrics & Gynecology

## 2013-04-09 ENCOUNTER — Encounter: Payer: Self-pay | Admitting: Obstetrics & Gynecology

## 2013-04-26 ENCOUNTER — Other Ambulatory Visit: Payer: Self-pay

## 2013-05-15 ENCOUNTER — Encounter: Payer: Self-pay | Admitting: Obstetrics & Gynecology

## 2013-06-12 ENCOUNTER — Other Ambulatory Visit: Payer: Self-pay | Admitting: Obstetrics & Gynecology

## 2013-06-15 ENCOUNTER — Other Ambulatory Visit: Payer: Self-pay | Admitting: Obstetrics & Gynecology

## 2013-06-25 ENCOUNTER — Encounter: Payer: Self-pay | Admitting: Obstetrics & Gynecology

## 2013-06-25 ENCOUNTER — Telehealth: Payer: Self-pay | Admitting: *Deleted

## 2013-06-25 NOTE — Telephone Encounter (Signed)
Pt made aware Dr Delsa Sale wrote prescription for pain medication.  Pt aware Rx will be left at front desk for pick up. Pt stated she will pick up on 06/26/13.

## 2013-07-02 ENCOUNTER — Inpatient Hospital Stay (HOSPITAL_COMMUNITY)
Admission: AD | Admit: 2013-07-02 | Discharge: 2013-07-02 | Disposition: A | Discharge status: Home / Self Care | Payer: BC Managed Care – PPO | Source: Ambulatory Visit | Attending: Emergency Medicine | Admitting: Emergency Medicine

## 2013-07-02 ENCOUNTER — Inpatient Hospital Stay (HOSPITAL_COMMUNITY): Payer: BC Managed Care – PPO

## 2013-07-02 ENCOUNTER — Encounter (HOSPITAL_COMMUNITY): Payer: Self-pay | Admitting: *Deleted

## 2013-07-02 DIAGNOSIS — K5900 Constipation, unspecified: Secondary | ICD-10-CM

## 2013-07-02 DIAGNOSIS — D219 Benign neoplasm of connective and other soft tissue, unspecified: Secondary | ICD-10-CM

## 2013-07-02 DIAGNOSIS — R109 Unspecified abdominal pain: Secondary | ICD-10-CM | POA: Insufficient documentation

## 2013-07-02 DIAGNOSIS — R935 Abnormal findings on diagnostic imaging of other abdominal regions, including retroperitoneum: Secondary | ICD-10-CM | POA: Insufficient documentation

## 2013-07-02 DIAGNOSIS — D259 Leiomyoma of uterus, unspecified: Secondary | ICD-10-CM | POA: Insufficient documentation

## 2013-07-02 DIAGNOSIS — K567 Ileus, unspecified: Secondary | ICD-10-CM

## 2013-07-02 HISTORY — DX: Ileus, unspecified: K91.89

## 2013-07-02 HISTORY — DX: Partial intestinal obstruction, unspecified as to cause: K56.600

## 2013-07-02 HISTORY — DX: Other postprocedural complications and disorders of digestive system: K56.7

## 2013-07-02 LAB — URINALYSIS, ROUTINE W REFLEX MICROSCOPIC
Bilirubin Urine: NEGATIVE
Glucose, UA: NEGATIVE mg/dL
Ketones, ur: 15 mg/dL — AB
LEUKOCYTES UA: NEGATIVE
NITRITE: NEGATIVE
PROTEIN: NEGATIVE mg/dL
Specific Gravity, Urine: 1.01 (ref 1.005–1.030)
UROBILINOGEN UA: 0.2 mg/dL (ref 0.0–1.0)
pH: 6 (ref 5.0–8.0)

## 2013-07-02 LAB — COMPREHENSIVE METABOLIC PANEL
ALT: 12 U/L (ref 0–35)
AST: 17 U/L (ref 0–37)
Albumin: 4 g/dL (ref 3.5–5.2)
Alkaline Phosphatase: 52 U/L (ref 39–117)
BILIRUBIN TOTAL: 0.3 mg/dL (ref 0.3–1.2)
BUN: 10 mg/dL (ref 6–23)
CO2: 25 mEq/L (ref 19–32)
Calcium: 9.4 mg/dL (ref 8.4–10.5)
Chloride: 105 mEq/L (ref 96–112)
Creatinine, Ser: 0.75 mg/dL (ref 0.50–1.10)
GFR calc Af Amer: >90 mL/min (ref >90)
GFR calc non Af Amer: >90 mL/min (ref >90)
Glucose, Bld: 92 mg/dL (ref 70–99)
Potassium: 4.3 mEq/L (ref 3.7–5.3)
Sodium: 142 mEq/L (ref 137–147)
Total Protein: 7.4 g/dL (ref 6.0–8.3)

## 2013-07-02 LAB — POCT PREGNANCY, URINE: PREG TEST UR: NEGATIVE

## 2013-07-02 LAB — CBC
HEMATOCRIT: 37.3 % (ref 36.0–46.0)
Hemoglobin: 12.8 g/dL (ref 12.0–15.0)
MCH: 31.2 pg (ref 26.0–34.0)
MCHC: 34.3 g/dL (ref 30.0–36.0)
MCV: 91 fL (ref 78.0–100.0)
Platelets: 421 10*3/uL — ABNORMAL HIGH (ref 150–400)
RBC: 4.1 MIL/uL (ref 3.87–5.11)
RDW: 12.7 % (ref 11.5–15.5)
WBC: 8.1 10*3/uL (ref 4.0–10.5)

## 2013-07-02 LAB — WET PREP, GENITAL
CLUE CELLS WET PREP: NONE SEEN
Trich, Wet Prep: NONE SEEN
Yeast Wet Prep HPF POC: NONE SEEN

## 2013-07-02 LAB — URINE MICROSCOPIC-ADD ON

## 2013-07-02 MED ORDER — PROMETHAZINE HCL 25 MG/ML IJ SOLN
25.0000 mg | Freq: Once | INTRAMUSCULAR | Status: AC
  Administered 2013-07-02: 25 mg via INTRAVENOUS
  Filled 2013-07-02: qty 1

## 2013-07-02 MED ORDER — IOHEXOL 300 MG/ML  SOLN
50.0000 mL | Freq: Once | INTRAMUSCULAR | Status: AC | PRN
  Administered 2013-07-02: 50 mL via ORAL

## 2013-07-02 MED ORDER — HYDROMORPHONE HCL PF 1 MG/ML IJ SOLN
1.0000 mg | Freq: Once | INTRAMUSCULAR | Status: AC
  Administered 2013-07-02: 1 mg via INTRAVENOUS
  Filled 2013-07-02: qty 1

## 2013-07-02 MED ORDER — SORBITOL 70 % SOLN
960.0000 mL | TOPICAL_OIL | ORAL | Status: AC
  Administered 2013-07-02: 960 mL via RECTAL
  Filled 2013-07-02: qty 240

## 2013-07-02 MED ORDER — DOCUSATE SODIUM 100 MG PO CAPS: 100.0000 mg | ORAL_CAPSULE | Freq: Two times a day (BID) | ORAL | Status: DC

## 2013-07-02 MED ORDER — FLEET ENEMA 7-19 GM/118ML RE ENEM: 1.0000 | ENEMA | Freq: Once | RECTAL | Status: DC

## 2013-07-02 MED ORDER — SODIUM CHLORIDE 0.9 % IV SOLN
INTRAVENOUS | Status: DC
  Administered 2013-07-02: 12:00:00 via INTRAVENOUS

## 2013-07-02 MED ORDER — IOHEXOL 300 MG/ML  SOLN
100.0000 mL | Freq: Once | INTRAMUSCULAR | Status: AC | PRN
  Administered 2013-07-02: 100 mL via INTRAVENOUS

## 2013-07-02 MED ORDER — ONDANSETRON HCL 4 MG/2ML IJ SOLN
4.0000 mg | Freq: Once | INTRAMUSCULAR | Status: AC
  Administered 2013-07-02: 4 mg via INTRAVENOUS
  Filled 2013-07-02: qty 2

## 2013-07-02 NOTE — ED Notes (Signed)
Pt with large bowel movement.  EDP made aware.  Pt to be discharged.

## 2013-07-02 NOTE — Discharge Instructions (Signed)

## 2013-07-02 NOTE — ED Provider Notes (Signed)
CSN: [id number]     Arrival date & time 07/02/13  0940 History   First MD Initiated Contact with Patient 07/02/13 1650     Chief Complaint  Patient presents with  . Abdominal Pain   (Consider location/radiation/quality/duration/timing/severity/associated sxs/prior Treatment) HPI Comments: Pt comes in with cc of abd pain. [facility] as she had hx of endometriosis (has hx of laparotomies before)  - they dod a CT abdomen - and the CT showed fecal impaction - and patient was sent to the ED for further evaluation. Pt's last BM was Friday, and she feels constipated, and doesn't think she is passing flatus. She has no hx of SBO.    Patient is a 38 y.o. female presenting with abdominal pain. The history is provided by the patient.  Abdominal Pain Associated symptoms: constipation and nausea   Associated symptoms: no chest pain, no dysuria, no shortness of breath and no vomiting     Past Medical History  Diagnosis Date  . Endometriosis   . Fibroids   . Ileus, postoperative   . Partial bowel obstruction    Past Surgical History  Procedure Laterality Date  . Laparoscopic abdominal exploration     Family History  Problem Relation Age of Onset  . Hypertension Maternal Grandmother    History  Substance Use Topics  . Smoking status: Never Smoker   . Smokeless tobacco: Never Used  . Alcohol Use: No   OB History   Grav Para Term Preterm Abortions TAB SAB Ect Mult Living                 Review of Systems  Constitutional: Negative for activity change.  Respiratory: Negative for shortness of breath.   Cardiovascular: Negative for chest pain.  Gastrointestinal: Positive for nausea, abdominal pain and constipation. Negative for vomiting.  Genitourinary: Negative for dysuria.  Musculoskeletal: Negative for neck pain.  Neurological: Negative for headaches.    Allergies  Morphine and related and Reglan  Home Medications   Current Outpatient Rx  Name  Route  Sig   Dispense  Refill  . amitriptyline (ELAVIL) 25 MG tablet   Oral   Take 2 tablets (50 mg total) by mouth at bedtime. If tolerated after 1 - 2 weeks can increase to 75 mg.   120 tablet   2   . ibuprofen (ADVIL,MOTRIN) 200 MG tablet   Oral   Take 400 mg by mouth once.         Marland Kitchen letrozole (FEMARA) 2.5 MG tablet      TAKE 1 TABLET EVERY DAY   30 tablet   4   . Multiple Vitamin (MULTIVITAMIN WITH MINERALS) TABS tablet   Oral   Take 1 tablet by mouth daily.         . norethindrone (AYGESTIN) 5 MG tablet   Oral   Take 1 tablet (5 mg total) by mouth 2 (two) times daily.   120 tablet   3   . oxyCODONE-acetaminophen (PERCOCET) 10-325 MG per tablet   Oral   Take 1 tablet by mouth 3 (three) times daily as needed for pain.   90 tablet   0   . oxymetazoline (AFRIN) 0.05 % nasal spray   Each Nare   Place 1 spray into both nostrils 2 (two) times daily as needed for congestion.         . promethazine (PHENERGAN) 12.5 MG tablet      TAKE 2 TABLETS BY MOUTH EVERY 8 HOURS  AS NEEDED.   30 tablet   1   . Pseudoephedrine HCl (SUDAFED 24 HOUR PO)   Oral   Take 1 tablet by mouth daily as needed. For congestion         . zolpidem (AMBIEN) 10 MG tablet      TAKE 1 TABLET AT BEDTIME AS NEEDED   30 tablet   1    BP 113/80  Pulse 94  Temp(Src) 98.5 F (36.9 C) (Oral)  Resp 18  Ht 5\' 6"  (1.676 m)  Wt 135 lb (61.236 kg)  BMI 21.80 kg/m2  SpO2 100% Physical Exam  Nursing note and vitals reviewed. Constitutional: She is oriented to person, place, and time. She appears well-developed and well-nourished.  HENT:  Head: Normocephalic and atraumatic.  Eyes: EOM are normal. Pupils are equal, round, and reactive to light.  Neck: Neck supple.  Cardiovascular: Normal rate, regular rhythm and normal heart sounds.   No murmur heard. Pulmonary/Chest: Effort normal. No respiratory distress.  Abdominal: Soft. She exhibits no distension. There is tenderness. There is no rebound and no  guarding.  Diffuse abd tenderness, firm abdomen, voluntary guarding and no rebound tenderness.  Neurological: She is alert and oriented to person, place, and time.  Skin: Skin is warm and dry.    ED Course  Procedures (including critical care time) Labs Review Labs Reviewed  WET PREP, GENITAL - Abnormal; Notable for the following:    WBC, Wet Prep HPF POC FEW (*)    All other components within normal limits  URINALYSIS, ROUTINE W REFLEX MICROSCOPIC - Abnormal; Notable for the following:    Hgb urine dipstick MODERATE (*)    Ketones, ur 15 (*)    All other components within normal limits  URINE MICROSCOPIC-ADD ON - Abnormal; Notable for the following:    Squamous Epithelial / LPF FEW (*)    All other components within normal limits  CBC - Abnormal; Notable for the following:    Platelets 421 (*)    All other components within normal limits  GC/CHLAMYDIA PROBE AMP  COMPREHENSIVE METABOLIC PANEL  POCT PREGNANCY, URINE   Imaging Review Ct Abdomen Pelvis W Contrast  07/02/2013   CLINICAL DATA:  Abdominal pain  EXAM: CT ABDOMEN AND PELVIS WITH CONTRAST  TECHNIQUE: Multidetector CT imaging of the abdomen and pelvis was performed using the standard protocol following bolus administration of intravenous contrast.  CONTRAST:  OMNIPAQUE IOHEXOL 300 MG/ML  SOLN  COMPARISON:  03/15/2008  FINDINGS: Liver, gallbladder, spleen, pancreas, adrenal glands, and kidneys are within normal limits.  Extensive stool burden throughout the length of the colon.  Mild distention of small bowel loops in the stomach likely due to stool burden. Distal ileum is fecalized.  Bladder is distended.  Hyper enhancing foci within the uterus are likely fibroids. There small. 3.6 cm septated fluid-filled structure with calcification is adjacent to the fundus of the uterus. This may represent non-opacified small bowel loops for cystic changes in the right ovary.  No destructive bone lesion.  No free-fluid.  No abnormal  retroperitoneal adenopathy.  IMPRESSION: Extensive stool burden throughout the colon resulting in fecalization of the ileum and dilatation of small bowel loops.  3.6 cm cystic area adjacent to the fundus of the uterus. This may represent non-opacified bowel loops for cystic lesion in the ovary. Followup ultrasound in 6-12 weeks is recommended.   Electronically Signed   By: Maryclare Bean M.D.   On: 07/02/2013 14:21    EKG Interpretation  None       MDM   1. Abdominal pain, acute   2. Fibroids   3. Abnormal CT of the abdomen    Pt comes in with cc of abd pain. Hx of abdominal surgery in the past, no SBO. Pt had a CT with contrast:  IMPRESSION: Extensive stool burden throughout the colon resulting in fecalization of the ileum and dilatation of small bowel loops.  3.6 cm cystic area adjacent to the fundus of the uterus. This may represent non-opacified bowel loops for cystic lesion in the ovary. Followup ultrasound in 6-12 weeks is recommended.  Spoke with Radiologist - and it appears that there is no transitional point due to adhesions or compression - and he thinks that likely patient just has large fecal material backing up.  We gave patient an enema -and she had a large BM - "like a baby came out" per patient. She feels a lot better. Repeat abd exam is showing soft abdomen, and non tender.  Advised her to slow narcotic use, and see her pcp. Advised increased fluid and fiber in the diet.   Varney Biles, MD 07/02/13 2013

## 2013-07-02 NOTE — ED Notes (Signed)
Provider at the bedside.  

## 2013-07-02 NOTE — ED Notes (Signed)
Pt requesting medication for nausea.  EDP made aware.

## 2013-07-02 NOTE — MAU Provider Note (Signed)
History     CSN: [id number]  Arrival date and time: 07/02/13 0940   First Provider Initiated Contact with Patient 07/02/13 1133      Chief Complaint  Patient presents with  . Abdominal Pain    HPI  [name] is a 38 y.o. non pregnant female who presents with severe abdominal pain; upper mid abdominal pain that started this morning at 0300 this morning. The pain is described as constant; however radiates to other parts of her upper abdomen. Patient has had this pain before when she had an illus with partial bowel obstruction.  Last BM was this morning and then last Friday; normal BM. Patient has been unable to tolerate food; ate only a smoothie yesterday.  Patient unable to eat today.  Pt does have a history of endometriosis and is on narcotics for pain management.   OB History   Grav Para Term Preterm Abortions TAB SAB Ect Mult Living                  Past Medical History  Diagnosis Date  . Endometriosis   . Fibroids   . Ileus, postoperative   . Partial bowel obstruction     Past Surgical History  Procedure Laterality Date  . Laparoscopic abdominal exploration      Family History  Problem Relation Age of Onset  . Hypertension Maternal Grandmother     History  Substance Use Topics  . Smoking status: Never Smoker   . Smokeless tobacco: Never Used  . Alcohol Use: No    Allergies:  Allergies  Allergen Reactions  . Morphine And Related Hives  . Reglan [Metoclopramide] Anxiety    Prescriptions prior to admission  Medication Sig Dispense Refill  . amitriptyline (ELAVIL) 25 MG tablet Take 2 tablets (50 mg total) by mouth at bedtime. If tolerated after 1 - 2 weeks can increase to 75 mg.  120 tablet  2  . ibuprofen (ADVIL,MOTRIN) 200 MG tablet Take 400 mg by mouth once.      Marland Kitchen letrozole (FEMARA) 2.5 MG tablet TAKE 1 TABLET EVERY DAY  30 tablet  4  . Multiple Vitamin (MULTIVITAMIN WITH MINERALS) TABS tablet Take 1 tablet by mouth daily.      .  norethindrone (AYGESTIN) 5 MG tablet Take 1 tablet (5 mg total) by mouth 2 (two) times daily.  120 tablet  3  . oxyCODONE-acetaminophen (PERCOCET) 10-325 MG per tablet Take 1 tablet by mouth 3 (three) times daily as needed for pain.  90 tablet  0  . oxymetazoline (AFRIN) 0.05 % nasal spray Place 1 spray into both nostrils 2 (two) times daily as needed for congestion.      . promethazine (PHENERGAN) 12.5 MG tablet TAKE 2 TABLETS BY MOUTH EVERY 8 HOURS AS NEEDED.  30 tablet  1  . Pseudoephedrine HCl (SUDAFED 24 HOUR PO) Take 1 tablet by mouth daily as needed. For congestion      . zolpidem (AMBIEN) 10 MG tablet TAKE 1 TABLET AT BEDTIME AS NEEDED  30 tablet  1   Results for orders placed during the hospital encounter of 07/02/13 (from the past 48 hour(s))  URINALYSIS, ROUTINE W REFLEX MICROSCOPIC     Status: Abnormal   Collection Time    07/02/13  9:45 AM      Result Value Range   Color, Urine YELLOW  YELLOW   APPearance CLEAR  CLEAR   Specific Gravity, Urine 1.010  1.005 - 1.030   pH 6.0  5.0 - 8.0   Glucose, UA NEGATIVE  NEGATIVE mg/dL   Hgb urine dipstick MODERATE (*) NEGATIVE   Bilirubin Urine NEGATIVE  NEGATIVE   Ketones, ur 15 (*) NEGATIVE mg/dL   Protein, ur NEGATIVE  NEGATIVE mg/dL   Urobilinogen, UA 0.2  0.0 - 1.0 mg/dL   Nitrite NEGATIVE  NEGATIVE   Leukocytes, UA NEGATIVE  NEGATIVE  URINE MICROSCOPIC-ADD ON     Status: Abnormal   Collection Time    07/02/13  9:45 AM      Result Value Range   Squamous Epithelial / LPF FEW (*) RARE   RBC / HPF 3-6  <3 RBC/hpf   Bacteria, UA RARE  RARE  POCT PREGNANCY, URINE     Status: None   Collection Time    07/02/13 10:27 AM      Result Value Range   Preg Test, Ur NEGATIVE  NEGATIVE   Comment:            THE SENSITIVITY OF THIS     METHODOLOGY IS >24 mIU/mL  CBC     Status: Abnormal   Collection Time    07/02/13 11:36 AM      Result Value Range   WBC 8.1  4.0 - 10.5 K/uL   RBC 4.10  3.87 - 5.11 MIL/uL   Hemoglobin 12.8  12.0 -  15.0 g/dL   HCT 37.3  36.0 - 46.0 %   MCV 91.0  78.0 - 100.0 fL   MCH 31.2  26.0 - 34.0 pg   MCHC 34.3  30.0 - 36.0 g/dL   RDW 12.7  11.5 - 15.5 %   Platelets 421 (*) 150 - 400 K/uL  COMPREHENSIVE METABOLIC PANEL     Status: None   Collection Time    07/02/13 11:36 AM      Result Value Range   Sodium 142  137 - 147 mEq/L   Potassium 4.3  3.7 - 5.3 mEq/L   Chloride 105  96 - 112 mEq/L   CO2 25  19 - 32 mEq/L   Glucose, Bld 92  70 - 99 mg/dL   BUN 10  6 - 23 mg/dL   Creatinine, Ser 0.75  0.50 - 1.10 mg/dL   Calcium 9.4  8.4 - 10.5 mg/dL   Total Protein 7.4  6.0 - 8.3 g/dL   Albumin 4.0  3.5 - 5.2 g/dL   AST 17  0 - 37 U/L   ALT 12  0 - 35 U/L   Alkaline Phosphatase 52  39 - 117 U/L   Total Bilirubin 0.3  0.3 - 1.2 mg/dL   GFR calc non Af Amer >90  >90 mL/min   GFR calc Af Amer >90  >90 mL/min   Comment: (NOTE)     The eGFR has been calculated using the CKD EPI equation.     This calculation has not been validated in all clinical situations.     eGFR's persistently <90 mL/min signify possible Chronic Kidney     Disease.  WET PREP, GENITAL     Status: Abnormal   Collection Time    07/02/13  1:00 PM      Result Value Range   Yeast Wet Prep HPF POC NONE SEEN  NONE SEEN   Trich, Wet Prep NONE SEEN  NONE SEEN   Clue Cells Wet Prep HPF POC NONE SEEN  NONE SEEN   WBC, Wet Prep HPF POC FEW (*) NONE SEEN   Comment: MODERATE BACTERIA SEEN  Ct Abdomen Pelvis W Contrast  07/02/2013   CLINICAL DATA:  Abdominal pain  EXAM: CT ABDOMEN AND PELVIS WITH CONTRAST  TECHNIQUE: Multidetector CT imaging of the abdomen and pelvis was performed using the standard protocol following bolus administration of intravenous contrast.  CONTRAST:  18m OMNIPAQUE IOHEXOL 300 MG/ML  SOLN  COMPARISON:  03/15/2008  FINDINGS: Liver, gallbladder, spleen, pancreas, adrenal glands, and kidneys are within normal limits.  Extensive stool burden throughout the length of the colon.  Mild distention of small bowel loops  in the stomach likely due to stool burden. Distal ileum is fecalized.  Bladder is distended.  Hyper enhancing foci within the uterus are likely fibroids. There small. 3.6 cm septated fluid-filled structure with calcification is adjacent to the fundus of the uterus. This may represent non-opacified small bowel loops for cystic changes in the right ovary.  No destructive bone lesion.  No free-fluid.  No abnormal retroperitoneal adenopathy.  IMPRESSION: Extensive stool burden throughout the colon resulting in fecalization of the ileum and dilatation of small bowel loops.  3.6 cm cystic area adjacent to the fundus of the uterus. This may represent non-opacified bowel loops for cystic lesion in the ovary. Followup ultrasound in 6-12 weeks is recommended.   Electronically Signed   By: AMaryclare BeanM.D.   On: 07/02/2013 14:21   Review of Systems  Constitutional: Negative for fever and chills.  Gastrointestinal: Positive for nausea, vomiting and abdominal pain. Negative for diarrhea, constipation, blood in stool and melena.   Physical Exam   Blood pressure 119/82, pulse 96, temperature 98.1 F (36.7 C), temperature source Oral, resp. rate 20, height 5' 6"  (1.676 m), weight 61.236 kg (135 lb).  Physical Exam  Constitutional: She is oriented to person, place, and time. She appears well-developed and well-nourished. She appears toxic. She has a sickly appearance. She appears ill. She appears distressed.  HENT:  Head: Normocephalic.  Eyes: Pupils are equal, round, and reactive to light.  Neck: Neck supple.  Cardiovascular: Normal rate.   Respiratory: Effort normal.  GI: She exhibits distension. There is tenderness in the right upper quadrant, right lower quadrant, epigastric area and left upper quadrant. There is rigidity and guarding. There is no CVA tenderness.  Patient would not allow me to exam her abdomen; I brought my hand near her upper abdomen and the patient pushed my hands away  Genitourinary: No  vaginal discharge found.  Speculum exam: Vagina - Small amount of creamy, brown discharge, no odor Cervix - No contact bleeding, no active bleeding from OS  Bimanual exam: Cervix closed, mild CMT  Uterus non tender, normal size Bilateral adnexal tenderness, no masses bilaterally GC/Chlam, wet prep done Chaperone present for exam.   Neurological: She is alert and oriented to person, place, and time.  Skin: Skin is warm. She is diaphoretic.  Psychiatric: Her behavior is normal.    MAU Course  Procedures None  MDM CBC CMET  UA- hematuria UPT NS IV start Dilaudid 1 mg Phenergan 25 mg IV  [facility] Dr[facility].  [facility]; patient to [facility] for further evaluation  It [facility] will plan to have the patient transferred under his care for further evaluation.  Patient currently rates her abdominal pain 7/10 1 mg of dilaudid repeated at 1500 1515: Dr. HJodi Mourning  notified of patient being transferred to [facility] for further workup.   Assessment and Plan   A:  1. Abdominal pain, acute   2. Fibroids   3. Abnormal CT of the abdomen     P:  Pain management in process Transfer [facility] via [facility] for further evaluation/ work up per Dr. Linus Galas, NP  07/02/2013, 3:26 PM

## 2013-07-02 NOTE — MAU Note (Signed)
Patient presents with complaint of severe abdominal pain since 0300 today.

## 2013-07-02 NOTE — ED Notes (Signed)
Materials paged for soap suds enema.

## 2013-07-02 NOTE — ED Notes (Signed)
Pt to department via [place]- pt here from [facility] for partial bowel obstruction. Pt given pain medication at [facility].

## 2013-07-02 NOTE — MAU Note (Signed)
Patient states she has a history of an ileus X 2 and 1 partial bowel obstruction following surgery for endometriosis and feels that this has happened again as it feels the same.

## 2013-07-05 LAB — GC/CHLAMYDIA PROBE AMP
CT Probe RNA: NEGATIVE
GC PROBE AMP APTIMA: NEGATIVE

## 2013-07-22 ENCOUNTER — Other Ambulatory Visit: Payer: Self-pay | Admitting: Obstetrics & Gynecology

## 2013-07-25 ENCOUNTER — Ambulatory Visit: Payer: BC Managed Care – PPO | Admitting: Obstetrics & Gynecology

## 2013-07-27 ENCOUNTER — Encounter: Payer: Self-pay | Admitting: Obstetrics & Gynecology

## 2013-08-08 ENCOUNTER — Encounter: Payer: Self-pay | Admitting: Obstetrics & Gynecology

## 2013-08-10 ENCOUNTER — Encounter: Payer: Self-pay | Admitting: Obstetrics & Gynecology

## 2013-08-26 ENCOUNTER — Other Ambulatory Visit: Payer: Self-pay | Admitting: Obstetrics & Gynecology

## 2013-08-28 NOTE — Telephone Encounter (Signed)
Please review

## 2013-09-13 ENCOUNTER — Encounter: Payer: Self-pay | Admitting: Obstetrics & Gynecology

## 2013-09-20 ENCOUNTER — Encounter: Payer: Self-pay | Admitting: Obstetrics & Gynecology

## 2013-10-16 ENCOUNTER — Other Ambulatory Visit: Payer: Self-pay | Admitting: Obstetrics & Gynecology

## 2013-10-17 NOTE — Telephone Encounter (Signed)
Please advise 

## 2013-10-18 NOTE — Telephone Encounter (Signed)
Ok to refill 

## 2013-10-18 NOTE — Telephone Encounter (Signed)
OK to refill

## 2013-11-01 ENCOUNTER — Encounter: Payer: Self-pay | Admitting: Obstetrics & Gynecology

## 2013-12-05 ENCOUNTER — Other Ambulatory Visit: Payer: Self-pay | Admitting: *Deleted

## 2013-12-05 DIAGNOSIS — G4700 Insomnia, unspecified: Secondary | ICD-10-CM

## 2013-12-05 MED ORDER — ZOLPIDEM TARTRATE 10 MG PO TABS: ORAL_TABLET | ORAL | Status: DC

## 2013-12-27 ENCOUNTER — Other Ambulatory Visit: Payer: Self-pay | Admitting: Obstetrics & Gynecology

## 2013-12-27 NOTE — Telephone Encounter (Signed)
Patient called 12/27/2013 at 1:46PM for refills on Promethazine 12.5mg  and Percocet 10-325mg .

## 2014-01-01 NOTE — Telephone Encounter (Signed)
Please advise.  Patient called yesterday and Today regarding Refill request.

## 2014-01-02 ENCOUNTER — Other Ambulatory Visit: Payer: Self-pay | Admitting: *Deleted

## 2014-01-02 DIAGNOSIS — N809 Endometriosis, unspecified: Secondary | ICD-10-CM

## 2014-01-02 DIAGNOSIS — N926 Irregular menstruation, unspecified: Secondary | ICD-10-CM

## 2014-01-02 MED ORDER — OXYCODONE-ACETAMINOPHEN 10-325 MG PO TABS: 1.0000 | ORAL_TABLET | Freq: Three times a day (TID) | ORAL | Status: DC | PRN

## 2014-01-08 NOTE — Telephone Encounter (Signed)
Please call pt.  She should not need the phenergan.

## 2014-01-09 NOTE — Telephone Encounter (Signed)
Verbal consent from Dr. Delsa Sale to refill promethazine 12.5mg  and percocet 10-325mg  on 01/02/14.   Patient notified that promethazine 12.5mg  was sent to her pharmacy and that she could come back and pick up her Rx. For Percocet.   Tried to contact the patient today 01/09/14 to let her know that Dr. Delsa Sale said she should no longer need the Phenergan. LM on VM to CB.

## 2014-01-15 NOTE — Telephone Encounter (Signed)
Patient notified that she should not need to take the phenergan anymore per Dr. Delsa Sale.

## 2014-02-03 ENCOUNTER — Other Ambulatory Visit: Payer: Self-pay | Admitting: Obstetrics & Gynecology

## 2014-02-04 NOTE — Telephone Encounter (Signed)
Please advise on refill.

## 2014-02-05 NOTE — Telephone Encounter (Signed)
Please address

## 2014-02-10 NOTE — Telephone Encounter (Signed)
OK to refill. Needs f/u appointment

## 2014-02-12 NOTE — Telephone Encounter (Signed)
Rx refilled - sent to [facility] to schedule appointment for follow up

## 2014-02-18 ENCOUNTER — Other Ambulatory Visit: Payer: Self-pay | Admitting: Obstetrics & Gynecology

## 2014-02-19 NOTE — Telephone Encounter (Signed)
Please review for refill- Patient is also requesting her pain medication- Percocet Patient has f/u appointment 03/07/14

## 2014-02-20 ENCOUNTER — Other Ambulatory Visit: Payer: Self-pay | Admitting: Obstetrics & Gynecology

## 2014-02-20 DIAGNOSIS — N809 Endometriosis, unspecified: Secondary | ICD-10-CM

## 2014-02-20 DIAGNOSIS — N926 Irregular menstruation, unspecified: Secondary | ICD-10-CM

## 2014-02-20 MED ORDER — AMITRIPTYLINE HCL 25 MG PO TABS: ORAL_TABLET | ORAL | Status: DC

## 2014-02-20 MED ORDER — OXYCODONE-ACETAMINOPHEN 10-325 MG PO TABS: 1.0000 | ORAL_TABLET | Freq: Three times a day (TID) | ORAL | Status: DC | PRN

## 2014-03-07 ENCOUNTER — Ambulatory Visit: Payer: BC Managed Care – PPO | Admitting: Obstetrics & Gynecology

## 2014-04-05 ENCOUNTER — Other Ambulatory Visit: Payer: Self-pay

## 2014-04-11 ENCOUNTER — Other Ambulatory Visit: Payer: Self-pay | Admitting: Obstetrics & Gynecology

## 2014-04-12 ENCOUNTER — Encounter: Payer: Self-pay | Admitting: Obstetrics & Gynecology

## 2014-04-12 ENCOUNTER — Ambulatory Visit (INDEPENDENT_AMBULATORY_CARE_PROVIDER_SITE_OTHER): Payer: BC Managed Care – PPO | Admitting: Obstetrics & Gynecology

## 2014-04-12 VITALS — BP 130/87 | HR 106 | Temp 98.8°F | Ht 66.0 in | Wt 166.0 lb

## 2014-04-12 DIAGNOSIS — N809 Endometriosis, unspecified: Secondary | ICD-10-CM

## 2014-04-12 DIAGNOSIS — N926 Irregular menstruation, unspecified: Secondary | ICD-10-CM

## 2014-04-12 DIAGNOSIS — Z23 Encounter for immunization: Secondary | ICD-10-CM

## 2014-04-12 DIAGNOSIS — R102 Pelvic and perineal pain: Secondary | ICD-10-CM

## 2014-04-12 LAB — CBC
HEMATOCRIT: 37.6 % (ref 36.0–46.0)
Hemoglobin: 12.6 g/dL (ref 12.0–15.0)
MCH: 30.7 pg (ref 26.0–34.0)
MCHC: 33.5 g/dL (ref 30.0–36.0)
MCV: 91.7 fL (ref 78.0–100.0)
PLATELETS: 468 10*3/uL — AB (ref 150–400)
RBC: 4.1 MIL/uL (ref 3.87–5.11)
RDW: 12.8 % (ref 11.5–15.5)
WBC: 7.1 10*3/uL (ref 4.0–10.5)

## 2014-04-12 LAB — LACTATE DEHYDROGENASE: LDH: 166 U/L (ref 94–250)

## 2014-04-12 MED ORDER — OXYCODONE-ACETAMINOPHEN 10-325 MG PO TABS: 1.0000 | ORAL_TABLET | Freq: Three times a day (TID) | ORAL | Status: DC | PRN

## 2014-04-12 MED ORDER — PROMETHAZINE HCL 12.5 MG PO TABS: ORAL_TABLET | ORAL | Status: AC

## 2014-04-13 LAB — CA 125: CA 125: 9 U/mL (ref <35)

## 2014-04-13 NOTE — Progress Notes (Signed)
Patient ID: [name], female   DOB: [birth year], 38 y.o.   MRN: [id number]  Chief Complaint  Patient presents with  . Problem    Pelvic Pain     HPI [name] is a 38 y.o. female.  Worsening RLQ pain that radiates down her leg.  HPI  Past Medical History  Diagnosis Date  . Endometriosis   . Fibroids   . Ileus, postoperative   . Partial bowel obstruction     Past Surgical History  Procedure Laterality Date  . Laparoscopic abdominal exploration      Family History  Problem Relation Age of Onset  . Hypertension Maternal Grandmother     Social History History  Substance Use Topics  . Smoking status: Never Smoker   . Smokeless tobacco: Never Used  . Alcohol Use: No    Allergies  Allergen Reactions  . Morphine And Related Hives  . Reglan [Metoclopramide] Anxiety    Current Outpatient Prescriptions  Medication Sig Dispense Refill  . amitriptyline (ELAVIL) 25 MG tablet TAKE 2TABS BY MOUTH AT BEDTIME (IF TOLERATED AFTER 1-2WEEKS CAN INCREASE TO 3TABS AT BEDTIME)  120 tablet  2  . Multiple Vitamin (MULTIVITAMIN WITH MINERALS) TABS tablet Take 1 tablet by mouth daily.      . Norethindrone Acetate (AYGESTIN PO) Take by mouth.      . oxyCODONE-acetaminophen (PERCOCET) 10-325 MG per tablet Take 1 tablet by mouth 3 (three) times daily as needed for pain.  90 tablet  0  . oxymetazoline (AFRIN) 0.05 % nasal spray Place 1 spray into both nostrils 2 (two) times daily as needed for congestion.      . promethazine (PHENERGAN) 12.5 MG tablet TAKE 2 TABLETS BY MOUTH EVERY 8 HOURS AS NEEDED.  30 tablet  1  . Pseudoephedrine HCl (SUDAFED 24 HOUR PO) Take 1 tablet by mouth daily as needed. For congestion      . zolpidem (AMBIEN) 10 MG tablet TAKE 1 TABLET AT BEDTIME AS NEEDED  30 tablet  1   No current facility-administered medications for this visit.    Review of Systems Review of Systems Constitutional: negative for fatigue and weight loss Respiratory: negative for  cough and wheezing Cardiovascular: negative for chest pain, fatigue and palpitations Gastrointestinal: negative for abdominal pain and change in bowel habits Genitourinary: positive for pelvic pain Integument/breast: negative for nipple discharge Musculoskeletal:negative for myalgias Neurological: negative for gait problems and tremors Behavioral/Psych: negative for abusive relationship, depression Endocrine: negative for temperature intolerance     Blood pressure 130/87, pulse 106, temperature 98.8 F (37.1 C), height 5\' 6"  (1.676 m), weight 75.297 kg (166 lb).  Physical Exam Physical Exam General:   alert  Skin:   no rash or abnormalities  Lungs:   clear to auscultation bilaterally  Heart:   regular rate and rhythm, S1, S2 normal, no murmur, click, rub or gallop  Abdomen:  normal findings: no organomegaly, soft, non-tender and no hernia  Pelvis:  External genitalia: normal general appearance Urinary system: urethral meatus normal and bladder without fullness, nontender Vaginal: normal without tenderness, induration or masses Cervix: normal appearance Adnexa: right--no palpable mass, mild tenderness Uterus: anteverted and non-tender, normal size       Data Reviewed None  Assessment    Endometriosis, pelvic pain    Plan    Orders Placed This Encounter  Procedures  . US Pelvis Complete    Standing Status: Future     Number of Occurrences:      Standing Expiration Date:  06/13/2015    Order Specific Question:  Reason for Exam (SYMPTOM  OR DIAGNOSIS REQUIRED)    Answer:  Pelvic Pain    Order Specific Question:  Preferred imaging location?    Answer:  [facility]  . DG Bone Density    Standing Status: Future     Number of Occurrences:      Standing Expiration Date: 06/13/2015    Order Specific Question:  Reason for Exam (SYMPTOM  OR DIAGNOSIS REQUIRED)    Answer:  Pelvic Pain    Order Specific Question:  Is the patient pregnant?    Answer:  No    Order  Specific Question:  Preferred imaging location?    Answer:  [facility]  . Flu Vaccine QUAD 36+ mos IM (Fluarix)  . CBC  . Lactate dehydrogenase  . CA 125   Meds ordered this encounter  Medications  . Norethindrone Acetate (AYGESTIN PO)    Sig: Take by mouth.  . oxyCODONE-acetaminophen (PERCOCET) 10-325 MG per tablet    Sig: Take 1 tablet by mouth 3 (three) times daily as needed for pain.    Dispense:  90 tablet    Refill:  0  . promethazine (PHENERGAN) 12.5 MG tablet    Sig: TAKE 2 TABLETS BY MOUTH EVERY 8 HOURS AS NEEDED.    Dispense:  30 tablet    Refill:  1   Encouraged f/u w/[facility] Follow up as needed; may consider a referral-->[facility] division of advanced laparoscopy/pelvic pain         JACKSON-MOORE,Talha Iser A 04/13/2014, 8:14 PM

## 2014-04-13 NOTE — Patient Instructions (Signed)
Bone Health Our bones do many things. They provide structure, protect organs, anchor muscles, and store calcium. Adequate calcium in your diet and weight-bearing physical activity help build strong bones, improve bone amounts, and may reduce the risk of weakening of bones (osteoporosis) later in life. PEAK BONE MASS By age 39, the average woman has acquired most of her skeletal bone mass. A large decline occurs in older adults which increases the risk of osteoporosis. In women this occurs around the time of menopause. It is important for young girls to reach their peak bone mass in order to maintain bone health throughout life. A person with high bone mass as a young adult will be more likely to have a higher bone mass later in life. Not enough calcium consumption and physical activity early on could result in a failure to achieve optimum bone mass in adulthood. OSTEOPOROSIS Osteoporosis is a disease of the bones. It is defined as low bone mass with deterioration of bone structure. Osteoporosis leads to an increase risk of fractures with falls. These fractures commonly happen in the wrist, hip, and spine. While men and women of all ages and background can develop osteoporosis, some of the risk factors for osteoporosis are:  Female.  White.  Postmenopausal.  Older adults.  Small in body size.  Eating a diet low in calcium.  Physically inactive.  Smoking.  Use of some medications.  Family history. CALCIUM Calcium is a mineral needed by the body for healthy bones, teeth, and proper function of the heart, muscles, and nerves. The body cannot produce calcium so it must be absorbed through food. Good sources of calcium include:  Dairy products (low fat or nonfat milk, cheese, and yogurt).  Dark green leafy vegetables (bok choy and broccoli).  Calcium fortified foods (orange juice, cereal, bread, soy beverages, and tofu products).  Nuts (almonds). Recommended amounts of calcium vary  for individuals. RECOMMENDED CALCIUM INTAKES Age and Amount in mg per day  Children 1 to 3 years / 700 mg  Children 4 to 8 years / 1,000 mg  Children 9 to 13 years / 1,300 mg  Teens 14 to 18 years / 1,300 mg  Adults 19 to 50 years / 1,000 mg  Adult women 51 to 70 years / 1,200 mg  Adults 71 years and older / 1,200 mg  Pregnant and breastfeeding teens / 1,300 mg  Pregnant and breastfeeding adults / 1,000 mg Vitamin D also plays an important role in healthy bone development. Vitamin D helps in the absorption of calcium. WEIGHT-BEARING PHYSICAL ACTIVITY Regular physical activity has many positive health benefits. Benefits include strong bones. Weight-bearing physical activity early in life is important in reaching peak bone mass. Weight-bearing physical activities cause muscles and bones to work against gravity. Some examples of weight bearing physical activities include:  Walking, jogging, or running.  Field Hockey.  Jumping rope.  Dancing.  Soccer.  Tennis or Racquetball.  Stair climbing.  Basketball.  Hiking.  Weight lifting.  Aerobic fitness classes. Including weight-bearing physical activity into an exercise plan is a great way to keep bones healthy. Adults: Engage in at least 30 minutes of moderate physical activity on most, preferably all, days of the week. Children: Engage in at least 60 minutes of moderate physical activity on most, preferably all, days of the week. FOR MORE INFORMATION [place] Department of Agriculture, Center for Nutrition Policy and Promotion: www.cnpp.usda.gov National Osteoporosis Foundation: www.nof.org Document Released: 08/28/2003 Document Revised: 10/02/2012 Document Reviewed: 11/27/2008 ExitCare Patient Information   2015 [employer]. This information is not intended to replace advice given to you by your health care provider. Make sure you discuss any questions you have with your health care provider.  

## 2014-04-15 ENCOUNTER — Telehealth: Payer: Self-pay

## 2014-04-15 NOTE — Telephone Encounter (Signed)
let patient's sister know of appt dates for u/s and bone density scan at [facility]

## 2014-04-18 ENCOUNTER — Other Ambulatory Visit: Payer: Self-pay | Admitting: Obstetrics & Gynecology

## 2014-04-18 DIAGNOSIS — R102 Pelvic and perineal pain: Secondary | ICD-10-CM

## 2014-04-19 ENCOUNTER — Ambulatory Visit (HOSPITAL_COMMUNITY): Admission: RE | Admit: 2014-04-19 | Payer: BC Managed Care – PPO | Source: Ambulatory Visit

## 2014-05-14 ENCOUNTER — Other Ambulatory Visit: Payer: Self-pay | Admitting: Obstetrics & Gynecology

## 2014-05-15 NOTE — Telephone Encounter (Signed)
Please advise 

## 2014-05-22 ENCOUNTER — Ambulatory Visit: Payer: BC Managed Care – PPO | Admitting: Obstetrics & Gynecology

## 2014-05-27 ENCOUNTER — Ambulatory Visit: Payer: BC Managed Care – PPO | Admitting: Obstetrics & Gynecology

## 2014-05-27 ENCOUNTER — Telehealth: Payer: Self-pay | Admitting: *Deleted

## 2014-05-27 NOTE — Telephone Encounter (Signed)
Pt called for refill on pain medication.  In review with Dr Delsa Sale, script to be left at front desk for pt to pick up.  Pt made aware.

## 2014-06-04 ENCOUNTER — Other Ambulatory Visit: Payer: Self-pay | Admitting: Obstetrics & Gynecology

## 2014-06-04 NOTE — Telephone Encounter (Signed)
Please review and advise.

## 2014-06-11 NOTE — Telephone Encounter (Signed)
Patient notified that we have sent several messages regarding refill but that I would ask her in person tomorrow. Patient voiced understanding.

## 2014-06-12 NOTE — Telephone Encounter (Signed)
Per Dr. Delsa Sale ok to refill. Rx sent to pharmacy and patient notified.

## 2014-06-15 NOTE — Telephone Encounter (Signed)
Ok to refill 

## 2014-06-17 ENCOUNTER — Encounter: Payer: Self-pay | Admitting: *Deleted

## 2014-06-18 ENCOUNTER — Encounter: Payer: Self-pay | Admitting: Obstetrics & Gynecology

## 2014-07-03 ENCOUNTER — Other Ambulatory Visit: Payer: Self-pay | Admitting: Obstetrics

## 2014-07-03 DIAGNOSIS — N926 Irregular menstruation, unspecified: Secondary | ICD-10-CM

## 2014-07-03 DIAGNOSIS — N809 Endometriosis, unspecified: Secondary | ICD-10-CM

## 2014-07-03 MED ORDER — OXYCODONE-ACETAMINOPHEN 10-325 MG PO TABS: 1.0000 | ORAL_TABLET | Freq: Three times a day (TID) | ORAL | Status: DC | PRN

## 2014-09-26 ENCOUNTER — Telehealth: Payer: Self-pay | Admitting: *Deleted

## 2014-09-26 NOTE — Telephone Encounter (Signed)
Call from pharmacy- patient is requesting a refill on her Promethazine. Patient is former patient of Dr Glennon Mac Moore/ [facility]. Please advise.

## 2014-09-27 NOTE — Telephone Encounter (Signed)
Denied.

## 2015-02-26 ENCOUNTER — Telehealth: Payer: Self-pay | Admitting: *Deleted

## 2015-02-26 NOTE — Telephone Encounter (Signed)
Refill request from pharmacy for promethazine 12.5mg .   Please advise.

## 2015-02-27 NOTE — Telephone Encounter (Signed)
Denied.

## 2015-03-05 NOTE — Telephone Encounter (Signed)
Notification that pt needs to contact office regarding refills.

## 2015-04-01 ENCOUNTER — Ambulatory Visit: Payer: BC Managed Care – PPO | Admitting: Physical Therapy

## 2015-04-04 ENCOUNTER — Ambulatory Visit: Payer: Self-pay | Admitting: Physical Therapy

## 2015-08-04 ENCOUNTER — Ambulatory Visit: Payer: BC Managed Care – PPO | Admitting: Physical Therapy

## 2015-08-06 ENCOUNTER — Ambulatory Visit: Payer: BC Managed Care – PPO | Attending: Obstetrics and Gynecology | Admitting: Physical Therapy

## 2015-08-06 ENCOUNTER — Encounter: Payer: Self-pay | Admitting: Physical Therapy

## 2015-08-06 DIAGNOSIS — L905 Scar conditions and fibrosis of skin: Secondary | ICD-10-CM

## 2015-08-06 DIAGNOSIS — M791 Myalgia, unspecified site: Secondary | ICD-10-CM

## 2015-08-06 DIAGNOSIS — N8189 Other female genital prolapse: Secondary | ICD-10-CM

## 2015-08-06 DIAGNOSIS — N907 Vulvar cyst: Secondary | ICD-10-CM | POA: Diagnosis not present

## 2015-08-06 DIAGNOSIS — M6289 Other specified disorders of muscle: Secondary | ICD-10-CM

## 2015-08-06 DIAGNOSIS — G729 Myopathy, unspecified: Secondary | ICD-10-CM | POA: Diagnosis present

## 2015-08-06 NOTE — Patient Instructions (Signed)
Sitting    Sit comfortably. Allow body's muscles to relax. Place hands on belly. Inhale slowly and deeply for __3_ seconds, so hands move out. Then take _3__ seconds to exhale. Repeat _5__ times. Do _2__ times a day.  Copyright  VHI. All rights reserved.  Supine    Lie flat with pillow support. Allow body's muscles to relax. Place hands on belly. Inhale slowly and deeply for 3___ seconds, so hands move up. Then take _3__ seconds to exhale. Repeat __5_ times. Do _2__ times a day.   Copyright  VHI. All rights reserved.  Bear Down    Exhaling, bear down as if to have a bowel movement. Repeat _3__ times. Do _4__ times a day.  Copyright  VHI. All rights reserved.   [facility] [address], [place], [place] [address] Phone # [phone] Fax [phone]

## 2015-08-06 NOTE — Therapy (Signed)
[facility] [address] [place], [place], [address] Phone: [phone]   Fax:  [phone]  Physical Therapy Evaluation  Patient Details  Name: [name] MRN: [id number] Date of Birth: 1976-06-02 Referring Provider: Dr. Norton Pastel  Encounter Date: 08/06/2015      PT End of Session - 08/06/15 1015    Visit Number 1   Date for PT Re-Evaluation 12/04/15   PT Start Time 0930   PT Stop Time 1015   PT Time Calculation (min) 45 min   Activity Tolerance Patient tolerated treatment well   Behavior During Therapy [facility] for tasks assessed/performed      Past Medical History  Diagnosis Date  . Endometriosis   . Fibroids   . Ileus, postoperative   . Partial bowel obstruction [facility])     Past Surgical History  Procedure Laterality Date  . Laparoscopic abdominal exploration      There were no vitals filed for this visit.  Visit Diagnosis:  Perineal floor weakness - Plan: PT plan of care cert/re-cert  Muscle pain - Plan: PT plan of care cert/re-cert  Scar of abdominal wall - Plan: PT plan of care cert/re-cert  Muscle tightness - Plan: PT plan of care cert/re-cert      Subjective Assessment - 08/06/15 0935    Subjective Patient had endometriosis in 2013 and had her first surgery. Chronic pain at different severities, 1 laproscopic and 3  open laperotomies. Has developed Fibromyalgia in the past 8 months and chronic pain with it daily.  Patient reports sometimes the pain is suddne and severe causing her to vomit and other times it can be gradually increasing.    How long can you walk comfortably? painful   Patient Stated Goals get off pain medication;    Currently in Pain? Yes   Pain Score 10-Worst pain ever  low 3/10   Pain Location Abdomen   Pain Orientation Anterior;Lower   Pain Descriptors / Indicators Shooting;Throbbing   Pain Type Chronic pain   Pain Radiating Towards radiate into right leg    Pain Onset More than a month ago   Pain Frequency Constant   Aggravating Factors  walking, stress   Pain Relieving Factors heat, movement            [facility] PT Assessment - 08/06/15 0001    Assessment   Medical Diagnosis N81.84 Pelvic Muscle Wasting   Referring Provider Dr. Norton Pastel   Onset Date/Surgical Date 06/22/11   Prior Therapy None   Precautions   Precautions None   Balance Screen   Has the patient fallen in the past 6 months No   Has the patient had a decrease in activity level because of a fear of falling?  No   Is the patient reluctant to leave their home because of a fear of falling?  No   Prior Function   Level of Independence Independent   Vocation Full time employment   Vocation Requirements walking  Nurse   Leisure rowing   Cognition   Overall Cognitive Status Within Functional Limits for tasks assessed   Observation/Other Assessments   Skin Integrity scar from umbilicus to pubic bone with indention and reduced mobility   Focus on Therapeutic Outcomes (FOTO)  46% limitation CK   Posture/Postural Control   Posture/Postural Control No significant limitations   ROM / Strength   AROM / PROM / Strength AROM;PROM;Strength   AROM   Overall AROM Comments Full lumbar ROM   Strength  Overall Strength Comments bilateral hip strength is 5/5   Palpation   Spinal mobility L3-L5 decreased mobility   SI assessment  right ilium is anteriorly rotated   Palpation comment tender in right psoas, lower abdominal, tightness in bil. diaphgram;    Special Tests   Sacroiliac Tests  Gaenslen's Test   Gaenslen's test   Findings Positive   Side  Right   Comments pain                 Pelvic Floor Special Questions - 08/06/15 0001    Are you Pregnant or attempting pregnancy? No   Prior Pregnancies No   Currently Sexually Active Yes   Marinoff Scale discomfort that does not affect completion  intense pain after orgasm   Urinary Leakage No   Fecal  incontinence No   Falling out feeling (prolapse) No   Skin Integrity Intact   Perineal Body/Introitus  Elevated   External Palpation tenderness located on bulbocavernosus, superior transverse perineum; around clitoria hood   Prolapse None   Pelvic Floor Internal Exam Patient has confirmed identification  and approves PT to assess muscle integrity and strength   Exam Type Vaginal   Sensation tenderness while therapist places her index finger into the intoritus and introitus was tight   Palpation tender obturator internist, levator ani, clitoral hood, bulbocavernosus   Strength weak squeeze, no lift  patient will bear down with contraction   Tone hypertonicity of pelvic floor muscles                  PT Education - 08/06/15 1014    Education provided Yes   Education Details diaphgramatic breathing, bearing down to stretch pelvic floor   Person(s) Educated Patient   Methods Explanation;Demonstration;Handout;Verbal cues   Comprehension Returned demonstration;Verbalized understanding          PT Short Term Goals - 08/06/15 1043    PT SHORT TERM GOAL #1   Title independent with initial HEP for flexibility exercises   Time 4   Period Weeks   Status New   PT SHORT TERM GOAL #2   Title independent with self perineal massage   Time 4   Period Weeks   Status New   PT SHORT TERM GOAL #3   Title pain in abdomen and pelvis decreased >/= 25% after orgasm due to increased mobility of tissue   Time 4   Period Weeks   Status New   PT SHORT TERM GOAL #4   Title mobility of abdominal scar improved by 25%   Time 4   Period Weeks   Status New           PT Long Term Goals - 08/06/15 1143    PT LONG TERM GOAL #1   Title independent with HEP and understand how to progress herself   Time 4   Period Months   Status New   PT LONG TERM GOAL #2   Title pain after orgasm decreased >/= 75% due to decreased pelvic floor muscle tightness   Time 4   Period Months   Status New    PT LONG TERM GOAL #3   Title ability to not have to take pain medicine due to using pain management technique including meditation, soft tissue work, flexiblity exercises   Time 4   Period Months   Status New   PT LONG TERM GOAL #4   Title abdominal scar has increased mobility >/= 505 to reduce abdominal pain   Time  4   Period Months   Status New   PT LONG TERM GOAL #5   Title constipation reduced >/= 25% due to reduced muscle tightness   Time 4   Period Months   Status New               Plan - 08/06/15 1019    Clinical Impression Statement Patient is a 40 year old female with diagnosis of pelvic muscle wasting.  Patient has had pain since 2013 when she had abdominal surgery for endometriosus.  Scar from umbilicus to  pubic bone is thick and decreased  mobility and adhered to the abdominal wall.  Pelvic foor strength is 2/5.  Patient will bear down with pelvic floor contraction. Right ilium is anterior rotation.  Decreased mobility of L3-L5.  Palpable tenderness to right psoas, lower abdominal aarea, clitoral hood, bil. bulbocavernosus, bil. obturator internist and bil. levaotr ani. Bil. diaphgram  and hip adductor is tight.  Peineal body is tender and thick. The vaginal  intoitus is small and tight.  FOTO score is 46% limited. Patient is moderate complexity.    Pt will benefit from skilled therapeutic intervention in order to improve on the following deficits Decreased activity tolerance;Impaired flexibility;Decreased endurance;Increased muscle spasms;Decreased scar mobility;Decreased coordination;Increased fascial restricitons;Pain  Home TENS unit   Rehab Potential Excellent   Clinical Impairments Affecting Rehab Potential None   PT Frequency 1x / week   PT Duration Other (comment)  4 months   PT Treatment/Interventions ADLs/Self Care Home Management;Biofeedback;Cryotherapy;Electrical Stimulation;Ultrasound;Moist Heat;Functional mobility training;Therapeutic  activities;Therapeutic exercise;Patient/family education;Neuromuscular re-education;Manual techniques;Scar mobilization;Dry needling;Passive range of motion   Recommended Other Services None   Consulted and Agree with Plan of Care Patient         Problem List Patient Active Problem List   Diagnosis Date Noted  . Endometriosis 10/25/2012    Earlie Counts, PT 08/06/2015 12:38 PM    [facility] [address] [place], [place], [address] Phone: [phone]   Fax:  [phone]  Name: Elandra Zangrilli MRN: [id number] Date of Birth: 17-Jul-1975

## 2015-08-13 ENCOUNTER — Ambulatory Visit: Payer: BC Managed Care – PPO | Admitting: Physical Therapy

## 2015-08-20 ENCOUNTER — Ambulatory Visit: Payer: BC Managed Care – PPO | Admitting: Physical Therapy

## 2015-08-27 ENCOUNTER — Ambulatory Visit: Payer: BC Managed Care – PPO | Attending: Obstetrics and Gynecology | Admitting: Physical Therapy

## 2015-08-27 ENCOUNTER — Encounter: Payer: Self-pay | Admitting: Physical Therapy

## 2015-08-27 DIAGNOSIS — G729 Myopathy, unspecified: Secondary | ICD-10-CM | POA: Insufficient documentation

## 2015-08-27 DIAGNOSIS — N907 Vulvar cyst: Secondary | ICD-10-CM | POA: Diagnosis present

## 2015-08-27 DIAGNOSIS — N8189 Other female genital prolapse: Secondary | ICD-10-CM

## 2015-08-27 DIAGNOSIS — M791 Myalgia, unspecified site: Secondary | ICD-10-CM

## 2015-08-27 DIAGNOSIS — L905 Scar conditions and fibrosis of skin: Secondary | ICD-10-CM | POA: Diagnosis present

## 2015-08-27 DIAGNOSIS — M6289 Other specified disorders of muscle: Secondary | ICD-10-CM

## 2015-08-27 NOTE — Patient Instructions (Addendum)
   Position yourself as shown grabbing onto feet or behind the knees. You should feel a gentle stretch. Breathe in and allow the pelvic floor muscles to relax.  Hold 30 sec to 60 sec 2 times, 1-2 times per day.    Start by facing down with your hands under your shoulders. Extend your arms to bring your chest up while relaxing your back.  Hold for 5 sec. 3 times 2 times per day   Lie on floor with hips as close to the wall as possible.  Allow legs to slide out and down as gravity assists. Hold for 30 sec to 1 min 3 times 2 times per day.  Can have your legs together to stretch hamstrings.  Hold 30 sec 2 times 1 time per day.     Lay on a mat or the floor with your feet close to a wall, prop one leg on the wall with the knee bent toward 90 degrees, cross your other ankle over your knee and let your leg sink into the stretch. If you don't feel the stretch bring your bottom closer to the wall.  Hold this in a relaxed position for a 2-3 minutes per leg, taking a few deep breaths, let your hips/knees rotate to either side if they need to.  Then do the butterfly stretch against wall hold 1-2 min 2 times 1 time per day.   [facility] [address] [place], [place] [address] Phone # [phone] Fax [phone] Isometric Hold (Hook-Lying)    Lie with hips and knees bent. Slowly inhale, and then exhale. Pull navel toward spine and Hold for _5__ seconds. Continue to breathe in and out during hold. Rest for _5__ seconds. Repeat 10___ times. Do __2_ times a day. Do not let the upper abdominals contract.    Copyright  VHI. All rights reserved.

## 2015-08-27 NOTE — Therapy (Addendum)
[facility] [address] [place], [place], [address] Phone: [phone]   Fax:  [phone]  Physical Therapy Treatment  Patient Details  Name: [name] MRN: [id number] Date of Birth: Nov 17, 1975 Referring Provider: Dr. Norton Pastel  Encounter Date: 08/27/2015      PT End of Session - 08/27/15 1029    Visit Number 2   Date for PT Re-Evaluation 12/04/15   PT Start Time 1029  patient came 15 min late   PT Stop Time 1058   PT Time Calculation (min) 29 min   Activity Tolerance Patient tolerated treatment well   Behavior During Therapy [facility] for tasks assessed/performed      Past Medical History  Diagnosis Date  . Endometriosis   . Fibroids   . Ileus, postoperative   . Partial bowel obstruction [facility])     Past Surgical History  Procedure Laterality Date  . Laparoscopic abdominal exploration      There were no vitals filed for this visit.  Visit Diagnosis:  Muscle pain  Perineal floor weakness  Scar of abdominal wall  Muscle tightness      Subjective Assessment - 08/27/15 1030    Subjective I have had 2 horrible weeks.  When in pain trouble fully emptying her bladder.    How long can you walk comfortably? painful   Patient Stated Goals get off pain medication;    Currently in Pain? Yes   Pain Score 5    Pain Location Abdomen   Pain Orientation Right;Left   Pain Descriptors / Indicators Radiating  vibrating   Pain Type Chronic pain   Pain Radiating Towards both legs   Pain Onset More than a month ago   Pain Frequency Constant   Aggravating Factors  walking, stress   Pain Relieving Factors heat, movement   Multiple Pain Sites No                      Pelvic Floor Special Questions - 08/27/15 0001    Diastasis Recti Yes, 1 finger width superior and inferior of umbilicus   Currently Sexually Active Yes           [facility] Adult PT Treatment/Exercise - 08/27/15 0001     Manual Therapy   Manual Therapy Soft tissue mobilization   Soft tissue mobilization to abdominal wall, scar, right psoas, bil. diaphragm                PT Education - 08/27/15 1043    Education provided Yes   Education Details flexibiliy exercises   Person(s) Educated Patient   Methods Explanation;Demonstration;Handout   Comprehension Verbalized understanding;Returned demonstration          PT Short Term Goals - 08/27/15 1210    PT SHORT TERM GOAL #1   Title independent with initial HEP for flexibility exercises   Time 4   Period Weeks   Status New   PT SHORT TERM GOAL #2   Title independent with self perineal massage   Time 4   Period Weeks   Status New   PT SHORT TERM GOAL #3   Title pain in abdomen and pelvis decreased >/= 25% after orgasm due to increased mobility of tissue   Period Weeks   Status New   PT SHORT TERM GOAL #4   Title mobility of abdominal scar improved by 25%   Time 4   Period Weeks   Status New  PT Long Term Goals - 08/06/15 1143    PT LONG TERM GOAL #1   Title independent with HEP and understand how to progress herself   Time 4   Period Months   Status New   PT LONG TERM GOAL #2   Title pain after orgasm decreased >/= 75% due to decreased pelvic floor muscle tightness   Time 4   Period Months   Status New   PT LONG TERM GOAL #3   Title ability to not have to take pain medicine due to using pain management technique including meditation, soft tissue work, flexiblity exercises   Time 4   Period Months   Status New   PT LONG TERM GOAL #4   Title abdominal scar has increased mobility >/= 505 to reduce abdominal pain   Time 4   Period Months   Status New   PT LONG TERM GOAL #5   Title constipation reduced >/= 25% due to reduced muscle tightness   Time 4   Period Months   Status New               Plan - 08/27/15 1207    Clinical Impression Statement Patient is a 56 year odl female with diagnosis of  pelvic muscle wasting. Pateint has increased tightness of abdominal musculature. Trigger points in right abdominal wall.  Patient has not met goals due to just starting therapy.  Paitent does not contract her lower abdominal correctly and over contracts the upper abdominals.  Patient will benefit from physical therapy to reduce pain in relax tissues.    Pt will benefit from skilled therapeutic intervention in order to improve on the following deficits Decreased activity tolerance;Impaired flexibility;Decreased endurance;Increased muscle spasms;Decreased scar mobility;Decreased coordination;Increased fascial restricitons;Pain  home TENS unit   Rehab Potential Excellent   Clinical Impairments Affecting Rehab Potential None   PT Frequency 1x / week   PT Duration Other (comment)  4 months   PT Treatment/Interventions ADLs/Self Care Home Management;Biofeedback;Cryotherapy;Electrical Stimulation;Ultrasound;Moist Heat;Functional mobility training;Therapeutic activities;Therapeutic exercise;Patient/family education;Neuromuscular re-education;Manual techniques;Scar mobilization;Dry needling;Passive range of motion   PT Next Visit Plan soft tissue work, lower abdominal strength   PT Home Exercise Plan relaxation breathing   Recommended Other Services None   Consulted and Agree with Plan of Care Patient        Problem List Patient Active Problem List   Diagnosis Date Noted  . Endometriosis 10/25/2012    Earlie Counts, PT 08/27/2015 12:12 PM    [facility] [address] [place], [place], [address] Phone: [phone]   Fax:  [phone]  Name: [name] MRN: [id number] Date of Birth: 1976/02/10    PHYSICAL THERAPY DISCHARGE SUMMARY  Visits from Start of Care: 2 Current functional level related to goals / functional outcomes: See above. Patient has cancelled many visits and has not rescheduled for any further visits.    Remaining deficits: See above.   Education / Equipment: HEP  Plan: Patient agrees to discharge.  Patient goals were not met. Patient is being discharged due to not returning since the last visit.  Thank you for referral. Earlie Counts, PT 10/17/2015 7:45 AM  ?????

## 2015-09-03 ENCOUNTER — Encounter: Payer: BC Managed Care – PPO | Admitting: Physical Therapy

## 2015-09-10 ENCOUNTER — Ambulatory Visit: Payer: BC Managed Care – PPO | Admitting: Physical Therapy

## 2015-09-16 ENCOUNTER — Ambulatory Visit: Payer: BC Managed Care – PPO | Admitting: Physical Therapy

## 2015-09-17 ENCOUNTER — Encounter: Payer: BC Managed Care – PPO | Admitting: Physical Therapy

## 2015-09-24 ENCOUNTER — Ambulatory Visit: Payer: BC Managed Care – PPO | Admitting: Physical Therapy

## 2015-10-01 ENCOUNTER — Ambulatory Visit: Payer: BC Managed Care – PPO | Attending: Obstetrics and Gynecology | Admitting: Physical Therapy

## 2017-07-27 ENCOUNTER — Other Ambulatory Visit (HOSPITAL_BASED_OUTPATIENT_CLINIC_OR_DEPARTMENT_OTHER): Payer: Self-pay

## 2017-07-27 DIAGNOSIS — G4700 Insomnia, unspecified: Secondary | ICD-10-CM

## 2017-08-12 ENCOUNTER — Encounter (HOSPITAL_BASED_OUTPATIENT_CLINIC_OR_DEPARTMENT_OTHER): Payer: BC Managed Care – PPO

## 2017-08-19 ENCOUNTER — Other Ambulatory Visit (HOSPITAL_BASED_OUTPATIENT_CLINIC_OR_DEPARTMENT_OTHER): Payer: Self-pay

## 2017-08-19 DIAGNOSIS — G4700 Insomnia, unspecified: Secondary | ICD-10-CM

## 2017-09-01 ENCOUNTER — Encounter (HOSPITAL_BASED_OUTPATIENT_CLINIC_OR_DEPARTMENT_OTHER): Payer: BC Managed Care – PPO

## 2017-09-15 ENCOUNTER — Ambulatory Visit (HOSPITAL_BASED_OUTPATIENT_CLINIC_OR_DEPARTMENT_OTHER): Payer: BC Managed Care – PPO | Attending: Obstetrics and Gynecology | Admitting: Internal Medicine

## 2017-09-15 DIAGNOSIS — R0683 Snoring: Secondary | ICD-10-CM | POA: Diagnosis not present

## 2017-09-15 DIAGNOSIS — F5101 Primary insomnia: Secondary | ICD-10-CM | POA: Insufficient documentation

## 2017-09-15 DIAGNOSIS — G4700 Insomnia, unspecified: Secondary | ICD-10-CM

## 2017-09-18 DIAGNOSIS — G4700 Insomnia, unspecified: Secondary | ICD-10-CM | POA: Diagnosis not present

## 2017-09-18 NOTE — Procedures (Signed)
    Patient Name: [name], [name] Date: 09/15/2017 Gender: Female D.O.B: Oct 18, 1975 Age (years): 62 Referring Provider: Illene Bolus MD Height (inches): 57 Interpreting Physician: Baird Lyons MD, ABSM Weight (lbs): 167 RPSGT: Baxter Flattery BMI: 27 MRN: [id number] Neck Size: 15.00  CLINICAL INFORMATION Sleep Study Type: NPSG  Indication for sleep study: Fatigue, Insomnia, Witnesses Apnea / Gasping During Sleep Epworth Sleepiness Score: 5  SLEEP STUDY TECHNIQUE As per the AASM Manual for the Scoring of Sleep and Associated Events v2.3 (April 2016) with a hypopnea requiring 4% desaturations.  The channels recorded and monitored were frontal, central and occipital EEG, electrooculogram (EOG), submentalis EMG (chin), nasal and oral airflow, thoracic and abdominal wall motion, anterior tibialis EMG, snore microphone, electrocardiogram, and pulse oximetry.  MEDICATIONS Medications self-administered by patient taken the night of the study : DULOXETINE, LISINOPRIL, TIZANIDINE, PROMETHAZINE, NORETHINDRONE ACETATE  SLEEP ARCHITECTURE The study was initiated at 10:13:19 PM and ended at 5:01:54 AM.  Sleep onset time was 91.8 minutes and the sleep efficiency was 60.6%%. The total sleep time was 247.5 minutes.  Stage REM latency was N/A minutes.  The patient spent 28.3%% of the night in stage N1 sleep, 71.7%% in stage N2 sleep, 0.0%% in stage N3 and 0.00% in REM.  Alpha intrusion was absent.  Supine sleep was 0.00%.  RESPIRATORY PARAMETERS The overall apnea/hypopnea index (AHI) was 1.0 per hour. There were 3 total apneas, including 1 obstructive, 2 central and 0 mixed apneas. There were 1 hypopneas and 0 RERAs.  The AHI during Stage REM sleep was N/A per hour.  AHI while supine was N/A per hour.  The mean oxygen saturation was 95.2%. The minimum SpO2 during sleep was 91.0%.  moderate snoring was noted during this study.  CARDIAC DATA The 2 lead EKG demonstrated sinus  rhythm. The mean heart rate was 66.9 beats per minute. Other EKG findings include: None.  LEG MOVEMENT DATA The total PLMS were 0 with a resulting PLMS index of 0.0. Associated arousal with leg movement index was 0.0 .  IMPRESSIONS - No significant obstructive sleep apnea occurred during this study (AHI = 1.0/h). - No significant central sleep apnea occurred during this study (CAI = 0.5/h). - The patient had minimal or no oxygen desaturation during the study (Min O2 = 91.0%) - The patient snored with moderate snoring volume. - No cardiac abnormalities were noted during this study. - Clinically significant periodic limb movements did not occur during sleep. No significant associated arousals. - Difficulty initiating and maintaining sleep consistent with intake history. Stages N3 and REM were absent on this study night.  DIAGNOSIS - Primary Insomnia - Primary Snoring  RECOMMENDATIONS - Manage as insomnia - Sleep hygiene should be reviewed to assess factors that may improve sleep quality. - Weight management and regular exercise should be initiated or continued if appropriate.  [Electronically signed] 09/18/2017 12:20 PM  Baird Lyons MD, [place], American Board of Sleep Medicine   NPI: [id number]  [place], [place] of Sleep Medicine  ELECTRONICALLY SIGNED ON:  09/18/2017, 12:21 PM [place] PH: [phone]   FX: [phone] [place]

## 2021-07-08 ENCOUNTER — Ambulatory Visit: Payer: BC Managed Care – PPO

## 2021-07-23 ENCOUNTER — Other Ambulatory Visit: Payer: Self-pay

## 2021-07-23 ENCOUNTER — Ambulatory Visit: Payer: BC Managed Care – PPO | Attending: Obstetrics and Gynecology

## 2021-07-23 DIAGNOSIS — R198 Other specified symptoms and signs involving the digestive system and abdomen: Secondary | ICD-10-CM | POA: Insufficient documentation

## 2021-07-23 DIAGNOSIS — M6281 Muscle weakness (generalized): Secondary | ICD-10-CM | POA: Insufficient documentation

## 2021-07-23 DIAGNOSIS — M62838 Other muscle spasm: Secondary | ICD-10-CM | POA: Insufficient documentation

## 2021-07-23 DIAGNOSIS — R279 Unspecified lack of coordination: Secondary | ICD-10-CM | POA: Diagnosis not present

## 2021-07-23 NOTE — Therapy (Signed)
[facility] @ [place], [place], [address] Phone: [phone]   Fax:  [phone]  Physical Therapy Evaluation  Patient Details  Name: Barri Neidlinger MRN: [id number] Date of Birth: 30-Sep-1975 Referring Provider (PT): Illene Bolus, MD   Encounter Date: 07/23/2021   PT End of Session - 07/23/21 1102     Visit Number 1    Date for PT Re-Evaluation 10/15/21    Authorization Type BCBS    PT Start Time 1016    PT Stop Time 1059    PT Time Calculation (min) 43 min    Activity Tolerance Patient tolerated treatment well    Behavior During Therapy [facility] for tasks assessed/performed             Past Medical History:  Diagnosis Date   Endometriosis    Fibroids    Ileus, postoperative ([place])    Partial bowel obstruction ([place])     Past Surgical History:  Procedure Laterality Date   LAPAROSCOPIC ABDOMINAL EXPLORATION      There were no vitals filed for this visit.    Subjective Assessment - 07/23/21 1017     Subjective Pt states that she has been having pelvic pain for over 20 years; she was diagnosed with severe endometriosis with 4 surgeries and under medical care since 2001 (first surgery). She is doing exercise daily, mostly rowing and some boxing. She notices improvement in pain with exercise. Pain will reach an 8/10 when most severe.    Pertinent History fibromyalgia, 4 abdominal surgeries, chornic fatigue syndrome    Patient Stated Goals To decrease muscle spasm and reduce pain; get off pain medications.    Currently in Pain? Yes    Pain Score 4     Pain Location Pelvis    Pain Orientation Right;Left    Pain Descriptors / Indicators Pressure;Sharp;Shooting;Spasm;Tightness    Pain Type Chronic pain    Pain Radiating Towards Into thighs and LB    Pain Onset More than a month ago    Pain Frequency Intermittent    Aggravating Factors  intercourse; initial movement    Pain Relieving Factors prolonged  movement/exercise, heat, resting, change of positions    Effect of Pain on Daily Activities Limiting, especially during flare ups    Multiple Pain Sites No                [facility] PT Assessment - 07/23/21 0001       Assessment   Medical Diagnosis R19.8 (ICD-10-CM) - Other specified symptoms and signs involving the digestive system and abdomen    Referring Provider (PT) Illene Bolus, MD    Onset Date/Surgical Date 06/22/99    Next MD Visit 09/17/21    Prior Therapy yes   pelvic floor PT prior to COVID     Precautions   Precautions None      Restrictions   Weight Bearing Restrictions No      Balance Screen   Has the patient fallen in the past 6 months No    Has the patient had a decrease in activity level because of a fear of falling?  No    Is the patient reluctant to leave their home because of a fear of falling?  No      Home Ecologist residence      Prior Function   Level of Independence Independent      Cognition   Overall Cognitive Status Within Functional Limits  for tasks assessed      Palpation   Palpation comment Significnat scar tissue restriction inferior to umbilicus; overall tenderness and restriction throughout abdomen                No emotional/communication barriers or cognitive limitation. Patient is motivated to learn. Patient understands and agrees with treatment goals and plan. PT explains patient will be examined in standing, sitting, and lying down to see how their muscles and joints work. When they are ready, they will be asked to remove their underwear so PT can examine their perineum. The patient is also given the option of providing their own chaperone as one is not provided in our facility. The patient also has the right and is explained the right to defer or refuse any part of the evaluation or treatment including the internal exam. With the patient's consent, PT will use one gloved finger to gently assess the  muscles of the pelvic floor, seeing how well it contracts and relaxes and if there is muscle symmetry. After, the patient will get dressed and PT and patient will discuss exam findings and plan of care. PT and patient discuss plan of care, schedule, attendance policy and HEP activities.         Objective measurements completed on examination: See above findings.     Pelvic Floor Special Questions - 07/23/21 0001     Prior Pelvic/Prostate Exam Yes    Are you Pregnant or attempting pregnancy? No    Prior Pregnancies No    Currently Sexually Active Yes   without foreplay   Is this Painful Yes   without foreplay; sometimes orgasm can be painful   History of sexually transmitted disease No    Marinoff Scale no problems    Urinary Leakage No   Can have difficulty with emptying completely   Urinary urgency No    Urinary frequency varies    Fecal incontinence No    Fluid intake not enough    Caffeine beverages no    Falling out feeling (prolapse) No    Skin Integrity Intact    Scar none    Perineal Body/Introitus  Elevated    External Palpation pubic symphysis where abdominal incision comes down inferiorly    Prolapse None    Pelvic Floor Internal Exam Pt identity confirmed and verbal consent provided for internal exam    Exam Type Vaginal    Sensation hypersensitivity    Palpation exquisite tenderness throughout superficial and deep muscle layers; uterine/bladder/urethra restriction    Strength good squeeze, good lift, able to hold agaisnt strong resistance    Strength # of reps 1    Strength # of seconds 4    Tone high              [facility] Adult PT Treatment/Exercise - 07/23/21 0001       Self-Care   Self-Care Other Self-Care Comments   self-scar tissue mobilizatoin     Neuro Re-ed    Neuro Re-ed Details  Diaphragmatic breathing for abdominal/pelvic floor expansion/relaxation      Exercises   Exercises Lumbar      Lumbar Exercises: Supine   Other Supine Lumbar  Exercises Happy baby 10 breaths; relaxation training with VCs/TCs      Lumbar Exercises: Quadruped   Madcat/Old Horse 20 reps   down training; VC/TCs for breath coordination   Other Quadruped Lumbar Exercises Child's pose, 10 breaths, relaxation training with VC/TCs  PT Education - 07/23/21 1102     Education Details Pt educatoin performed on muscle spasm and scar tissue restrictoin role in pain; instructed in self-scar tissue mobilization; iniitla HEP and down training exercises. [id number]    Person(s) Educated Patient    Methods Explanation;Demonstration;Tactile cues;Verbal cues;Handout    Comprehension Verbalized understanding              PT Short Term Goals - 07/23/21 1113       PT SHORT TERM GOAL #1   Title Pt will be independent with HEP.    Time 4    Period Weeks    Status New    Target Date 08/20/21      PT SHORT TERM GOAL #2   Title Pt will be independent with mindfulness and diaphragmatic breathing in order to improve pelvic floor relaxatoin and A/ROM.    Time 4    Period Weeks    Status New    Target Date 08/20/21               PT Long Term Goals - 07/23/21 1114       PT LONG TERM GOAL #1   Title Pt will be independent with advanced HEP.    Time 12    Period Weeks    Status New    Target Date 10/15/21      PT LONG TERM GOAL #2   Title Pt will reduce pelvic floor tightness in order to reduce dyspareunia and pain with orgasm by 75%.    Time 12    Period Weeks    Status New    Target Date 10/15/21      PT LONG TERM GOAL #3   Title Pt will report ability to stop taking pain medication and be able to perform exercises and self-manual techniques in order to provide pain management.    Time 12    Period Weeks    Status New    Target Date 10/15/21      PT LONG TERM GOAL #4   Title Pt will be able to demonstrate full pelvic floor A/ROM without pain in order to reduce pelvic floor relaxation and improve pelvic pain  to no greater than 2/10.    Time 12    Period Weeks    Status New    Target Date 10/15/21                    Plan - 07/23/21 1107     Clinical Impression Statement Pt is a 46 year old female with chief complaint of abdominal/pelvic pain for the last 20+ years due to endometriosis. Exam fidnings notbale for abdominal myofascial restriction, abdominal scar tissue restriction, high tone pelvic floor with hypersensitivity, muscle spasm throughout superficial and deep pelvic floor muscles, grade 4 pelvic floor stgth with inability to relax out of contraction, and restriction of uterus/bladder/urethra. Initial treatment included down training exercises and diaphragmatic breathing for improved abdominal and pelvic floor relaxation and mobility; we discussed how to perform self-scar tissue mobilization to abdomen to improve mobility. She will benefit from skilled PT intervention in order to address impairments, decrease pain, and improve QOL.    Personal Factors and Comorbidities Comorbidity 1;Comorbidity 2;Comorbidity 3+    Comorbidities fibromyalgia, 4 abdominal surgeries, chornic fatigue syndrome    Examination-Activity Limitations Stand;Squat;Bend;Locomotion Level;Lift;Carry    Examination-Participation Restrictions Interpersonal Relationship;Community Activity    Stability/Clinical Decision Making Evolving/Moderate complexity    Clinical Decision Making Moderate    Rehab  Potential Fair    PT Frequency 1x / week    PT Duration 12 weeks    PT Treatment/Interventions ADLs/Self Care Home Management;Biofeedback;Cryotherapy;Moist Heat;Therapeutic activities;Therapeutic exercise;Neuromuscular re-education;Manual techniques;Patient/family education;Scar mobilization;Passive range of motion;Dry needling;Spinal Manipulations    PT Next Visit Plan Plan to perform abdominal manual techniques to improve mobility and decrease sensitivity; progress down training techniques and mobility exercises.    PT  Home Exercise Plan [phone]    Consulted and Agree with Plan of Care Patient             Patient will benefit from skilled therapeutic intervention in order to improve the following deficits and impairments:  Decreased coordination, Decreased range of motion, Increased fascial restricitons, Impaired tone, Decreased endurance, Pain, Decreased activity tolerance, Decreased scar mobility, Hypomobility, Impaired flexibility, Postural dysfunction, Decreased strength, Decreased mobility  Visit Diagnosis: Other muscle spasm  Muscle weakness (generalized)  Unspecified lack of coordination     Problem List Patient Active Problem List   Diagnosis Date Noted   Endometriosis 10/25/2012    Heather Roberts, PT, DPT02/07/2309:18 AM   [facility] @ [place], [place], [address] Phone: [phone]   Fax:  [phone]  Name: Kathaleya Mcduffee MRN: [id number] Date of Birth: [birth year]

## 2021-07-23 NOTE — Patient Instructions (Addendum)
Scar tissue mobilization: The first phase of working on scar tissue is without lotion or oil. You will develop a feel for where your scar is restricted, or not moving as much as the skin around it. These are the areas you want to focus on. Begin by placing fingers on scar tissue; gently press the skin gently in one direction. Do not let your fingers slide over the skin, but pull any adhesions under it. Maintain pressure for several seconds. Perform this technique in different directions over each restricted spot you find. Pressure does not need to be intense, but only comfortable. Perform for 5-10 minutes daily.  Scar tissue desensitization: In order to decrease the sensitivity of scar tissue, use oil or lotion (perform after scar tissue mobilization). Very gently massage over and around scar, not increasing discomfort at all. Perform for several minutes daily.   Access Code: [email or web address] URL: [email or web address] Date: 07/23/2021 Prepared by: Heather Roberts  Exercises Supine Diaphragmatic Breathing - 1 x daily - 7 x weekly - 3 sets - 10 reps Cat Cow - 1 x daily - 7 x weekly - 3 sets - 10 reps Child's Pose Stretch - 1 x daily - 7 x weekly - 1 sets - 10 reps Happy Baby with Pelvic Floor Lengthening - 1 x daily - 7 x weekly - 1 sets - 10 reps

## 2021-08-13 ENCOUNTER — Ambulatory Visit: Payer: BC Managed Care – PPO

## 2021-08-20 ENCOUNTER — Ambulatory Visit: Payer: BC Managed Care – PPO

## 2021-09-02 ENCOUNTER — Ambulatory Visit: Payer: BC Managed Care – PPO | Attending: Obstetrics and Gynecology

## 2021-09-02 ENCOUNTER — Other Ambulatory Visit: Payer: Self-pay

## 2021-09-02 DIAGNOSIS — R279 Unspecified lack of coordination: Secondary | ICD-10-CM | POA: Insufficient documentation

## 2021-09-02 DIAGNOSIS — M62838 Other muscle spasm: Secondary | ICD-10-CM | POA: Insufficient documentation

## 2021-09-02 DIAGNOSIS — M6281 Muscle weakness (generalized): Secondary | ICD-10-CM | POA: Diagnosis present

## 2021-09-02 NOTE — Therapy (Signed)
?[facility] [place] ?[place], [place], [address] ?Phone: [phone]   Fax:  [phone] ? ?Physical Therapy Treatment ? ?Patient Details  ?Name: [name] ?MRN: [id number] ?Date of Birth: 1975/10/01 ?Referring Provider (PT): Illene Bolus, MD ? ? ?Encounter Date: 09/02/2021 ? ? PT End of Session - 09/02/21 1226   ? ? Visit Number 2   ? Date for PT Re-Evaluation 10/15/21   ? Authorization Type BCBS   ? PT Start Time 1227   ? PT Stop Time 1318   ? PT Time Calculation (min) 51 min   ? Activity Tolerance Patient tolerated treatment well   ? Behavior During Therapy [facility] for tasks assessed/performed   ? ?  ?  ? ?  ? ? ?Past Medical History:  ?Diagnosis Date  ? Endometriosis   ? Fibroids   ? Ileus, postoperative ([place])   ? Partial bowel obstruction ([place])   ? ? ?Past Surgical History:  ?Procedure Laterality Date  ? LAPAROSCOPIC ABDOMINAL EXPLORATION    ? ? ?There were no vitals filed for this visit. ? ? Subjective Assessment - 09/02/21 1226   ? ? Subjective Pt states that she has been having migraines daily for the last week. She has also been having severe pelvic pain and she reports not having any medication to help manage. She states that pain has been a daily 7-8/10. She states that she has to have an [place] soon, but due to hypersensitivity she is fearful of having this procedure.   ? Patient Stated Goals To decrease muscle spasm and reduce pain; get off pain medications.   ? Currently in Pain? Yes   ? Pain Score 8    ? Pain Location Pelvis   ? Pain Descriptors / Indicators Radiating;Pressure   ? Pain Type Chronic pain   ? Pain Radiating Towards into thighs and LB   ? Pain Onset More than a month ago   ? Pain Frequency Constant   ? Aggravating Factors  everything   ? Pain Relieving Factors moving around house   ? Multiple Pain Sites No   ? ?  ?  ? ?  ? ? ? ? ? ? ? ? ? ? ? ? ? ? ? ? ? ? ? ? [place] Adult PT Treatment/Exercise - 09/02/21 0001   ? ?  ? Self-Care  ?  Self-Care Other Self-Care Comments   Mindfulness  ?  ? Lumbar Exercises: Stretches  ? Other Lumbar Stretch Exercise Kneeling hip flexor stretc hwith opposite twist 2 x 60 sec bil   ?  ? Lumbar Exercises: Supine  ? Other Supine Lumbar Exercises bent knee fall out 10x bil   ?  ? Lumbar Exercises: Sidelying  ? Other Sidelying Lumbar Exercises Open book 10x bil   ?  ? Manual Therapy  ? Manual Therapy Myofascial release   ? Myofascial Release scar tissue mobilization; mobilization with movement to scar tissue with bent knee fall out   ? ?  ?  ? ?  ? ? ? ? ? ? ? ? ? ? PT Education - 09/02/21 1332   ? ? Education Details Pt education performed on mindfulness and HEP progressions.   ? Person(s) Educated Patient   ? Methods Explanation;Demonstration;Tactile cues;Verbal cues;Handout   ? Comprehension Verbalized understanding   ? ?  ?  ? ?  ? ? ? PT Short Term Goals - 09/02/21 1331   ? ?  ? PT SHORT TERM  GOAL #1  ? Title Pt will be independent with HEP.   ? Time 4   ? Period Weeks   ? Status Partially Met   ? Target Date 08/20/21   ?  ? PT SHORT TERM GOAL #2  ? Title Pt will be independent with mindfulness and diaphragmatic breathing in order to improve pelvic floor relaxatoin and A/ROM.   ? Time 4   ? Period Weeks   ? Status Partially Met   ? Target Date 08/20/21   ? ?  ?  ? ?  ? ? ? ? PT Long Term Goals - 09/02/21 1332   ? ?  ? PT LONG TERM GOAL #1  ? Title Pt will be independent with advanced HEP.   ? Time 12   ? Period Weeks   ? Status Partially Met   ? Target Date 10/15/21   ?  ? PT LONG TERM GOAL #2  ? Title Pt will reduce pelvic floor tightness in order to reduce dyspareunia and pain with orgasm by 75%.   ? Time 12   ? Period Weeks   ? Status Partially Met   ? Target Date 10/15/21   ?  ? PT LONG TERM GOAL #3  ? Title Pt will report ability to stop taking pain medication and be able to perform exercises and self-manual techniques in order to provide pain management.   ? Time 12   ? Period Weeks   ? Status Partially Met    ? Target Date 10/15/21   ?  ? PT LONG TERM GOAL #4  ? Title Pt will be able to demonstrate full pelvic floor A/ROM without pain in order to reduce pelvic floor relaxation and improve pelvic pain to no greater than 2/10.   ? Time 12   ? Period Weeks   ? Status Partially Met   ? Target Date 10/15/21   ? ?  ?  ? ?  ? ? ? ? ? ? ? ? Plan - 09/02/21 1232   ? ? Clinical Impression Statement Patient coming in with significant pain and has been unable to perform HEP since eval. Extensive work performed to abdominal scar tissue today including lifting, rolling, static pulls, and mobilization with movement with excellent improvement in pain and restriction throughout session. She was able to progress mobility exercises to help furhter improve anterior chain restriction. She reported decrease in pain to 5/10 when she was leaving. She will continue to benefit from skilled PT intervention in order to address impairments, decrease pain, and improve QOL.   ? PT Treatment/Interventions ADLs/Self Care Home Management;Biofeedback;Cryotherapy;Moist Heat;Therapeutic activities;Therapeutic exercise;Neuromuscular re-education;Manual techniques;Patient/family education;Scar mobilization;Passive range of motion;Dry needling;Spinal Manipulations   ? PT Next Visit Plan Plan to perform abdominal manual techniques to improve mobility and decrease sensitivity; progress down training techniques and mobility exercises; consider return to pelvic floor release.   ? PT Home Exercise Plan [phone]   ? Consulted and Agree with Plan of Care Patient   ? ?  ?  ? ?  ? ? ?Patient will benefit from skilled therapeutic intervention in order to improve the following deficits and impairments:  Decreased coordination, Decreased range of motion, Increased fascial restricitons, Impaired tone, Decreased endurance, Pain, Decreased activity tolerance, Decreased scar mobility, Hypomobility, Impaired flexibility, Postural dysfunction, Decreased strength, Decreased  mobility ? ?Visit Diagnosis: ?Other muscle spasm ? ?Muscle weakness (generalized) ? ?Unspecified lack of coordination ? ? ? ? ?Problem List ?Patient Active Problem List  ? Diagnosis Date Noted  ?  Endometriosis 10/25/2012  ? ?Heather Roberts, PT, DPT03/15/231:32 PM ? ? ?[facility] ?[facility] [place] ?[place], [place], [address] ?Phone: [phone]   Fax:  [phone] ? ?Name: [name] ?MRN: [id number] ?Date of Birth: [birth year] ? ? ? ?

## 2021-09-02 NOTE — Patient Instructions (Signed)
Mindfulness: ?A common source of pelvic floor muscle tension is stress and anxiety. ?Mindfulness training has been shown to be beneficial in reducing stress and ?anxiety by increasing bodily awareness and learning to relax unwanted ?tension. ?A good starting mindfulness practice is by Mitzi [place]; follow ?instructions below to access free recorded sessions: ?Type M[facility]i [place] into browser, go to website and click guided meditation, listen ?now, pick ?progressive relaxation? or ?relaxation body scan? ?If you do not like these sessions, try typing the above titles into YouTube for ?hundreds of options! ?A great YouTube option is [employer]: 10 minute body scan ?Mindfulness is a very Tree surgeon; therefore, it can take time and trial ?and error in order to find what is most beneficial for you. ?There are also apps you can pay to use: ?Headspace ?Calm ?----Deirdre Peer - body scan (10 min) ?

## 2021-09-09 ENCOUNTER — Ambulatory Visit: Payer: BC Managed Care – PPO

## 2021-09-09 ENCOUNTER — Other Ambulatory Visit: Payer: Self-pay

## 2021-09-09 DIAGNOSIS — R279 Unspecified lack of coordination: Secondary | ICD-10-CM

## 2021-09-09 DIAGNOSIS — M62838 Other muscle spasm: Secondary | ICD-10-CM | POA: Diagnosis not present

## 2021-09-09 DIAGNOSIS — M6281 Muscle weakness (generalized): Secondary | ICD-10-CM

## 2021-09-09 NOTE — Therapy (Signed)
[facility] ?[facility] ?[place], [place], [address] ?Phone: [phone]   Fax:  [phone] ? ?Physical Therapy Treatment ? ?Patient Details  ?Name: [name] ?MRN: [id number] ?Date of Birth: [birth year] ?Referring Provider (PT): Illene Bolus, MD ? ? ?Encounter Date: 09/09/2021 ? ? PT End of Session - 09/09/21 0931   ? ? Visit Number 3   ? Date for PT Re-Evaluation 10/15/21   ? Authorization Type BCBS   ? PT Start Time 0930   ? PT Stop Time 1014   ? PT Time Calculation (min) 44 min   ? Activity Tolerance Patient tolerated treatment well   ? Behavior During Therapy [facility] for tasks assessed/performed   ? ?  ?  ? ?  ? ? ?Past Medical History:  ?Diagnosis Date  ? Endometriosis   ? Fibroids   ? Ileus, postoperative ([place])   ? Partial bowel obstruction ([place])   ? ? ?Past Surgical History:  ?Procedure Laterality Date  ? LAPAROSCOPIC ABDOMINAL EXPLORATION    ? ? ?There were no vitals filed for this visit. ? ? Subjective Assessment - 09/09/21 0932   ? ? Subjective Pt states that she felt very good after last treatment session and denies any soreness. She states that migraines are still happeneing regularly. She states that new exercises are good, but she has not performed mindfulness yet.   ? Patient Stated Goals To decrease muscle spasm and reduce pain; get off pain medications.   ? Currently in Pain? Yes   ? Pain Score 5    ? Pain Location Pelvis   ? Pain Orientation Right;Left   ? Pain Descriptors / Indicators Pressure   ? Pain Onset More than a month ago   ? Pain Frequency Constant   ? Multiple Pain Sites No   ? ?  ?  ? ?  ? ? ? ? ? ? ? ? ?No emotional/communication barriers or cognitive limitation. Patient is motivated to learn. Patient understands and agrees with treatment goals and plan. PT explains patient will be examined in standing, sitting, and lying down to see how their muscles and joints work. When they are ready, they will be asked to remove their  underwear so PT can examine their perineum. The patient is also given the option of providing their own chaperone as one is not provided in our facility. The patient also has the right and is explained the right to defer or refuse any part of the evaluation or treatment including the internal exam. With the patient's consent, PT will use one gloved finger to gently assess the muscles of the pelvic floor, seeing how well it contracts and relaxes and if there is muscle symmetry. After, the patient will get dressed and PT and patient will discuss exam findings and plan of care. PT and patient discuss plan of care, schedule, attendance policy and HEP activities. ? ? ? ? ? ? ? ? ? ? ? ? [facility] Adult PT Treatment/Exercise - 09/09/21 0001   ? ?  ? Neuro Re-ed   ? Neuro Re-ed Details  Diaphragmatic breathing for abdominal/pelvic floor expansion/relaxation   ?  ? Lumbar Exercises: Supine  ? Other Supine Lumbar Exercises --   ?  ? Manual Therapy  ? Manual Therapy Myofascial release;Internal Pelvic Floor   ? Myofascial Release scar tissue mobilization; mobilization with movement to scar tissue with bent knee fall out   ? Internal Pelvic Floor superficial and deep pelvic floor muscle  release anddesensitization   ? ?  ?  ? ?  ? ? ? ? ? ? ? ? ? ? ? ? PT Short Term Goals - 09/02/21 1331   ? ?  ? PT SHORT TERM GOAL #1  ? Title Pt will be independent with HEP.   ? Time 4   ? Period Weeks   ? Status Partially Met   ? Target Date 08/20/21   ?  ? PT SHORT TERM GOAL #2  ? Title Pt will be independent with mindfulness and diaphragmatic breathing in order to improve pelvic floor relaxatoin and A/ROM.   ? Time 4   ? Period Weeks   ? Status Partially Met   ? Target Date 08/20/21   ? ?  ?  ? ?  ? ? ? ? PT Long Term Goals - 09/02/21 1332   ? ?  ? PT LONG TERM GOAL #1  ? Title Pt will be independent with advanced HEP.   ? Time 12   ? Period Weeks   ? Status Partially Met   ? Target Date 10/15/21   ?  ? PT LONG TERM GOAL #2  ? Title Pt will  reduce pelvic floor tightness in order to reduce dyspareunia and pain with orgasm by 75%.   ? Time 12   ? Period Weeks   ? Status Partially Met   ? Target Date 10/15/21   ?  ? PT LONG TERM GOAL #3  ? Title Pt will report ability to stop taking pain medication and be able to perform exercises and self-manual techniques in order to provide pain management.   ? Time 12   ? Period Weeks   ? Status Partially Met   ? Target Date 10/15/21   ?  ? PT LONG TERM GOAL #4  ? Title Pt will be able to demonstrate full pelvic floor A/ROM without pain in order to reduce pelvic floor relaxation and improve pelvic pain to no greater than 2/10.   ? Time 12   ? Period Weeks   ? Status Partially Met   ? Target Date 10/15/21   ? ?  ?  ? ?  ? ? ? ? ? ? ? ? Plan - 09/09/21 0936   ? ? Clinical Impression Statement Return to pelvic floor muscle release anddesensitizion performed today due to relatively low pain levels compared to last week. She demosntrated high tone pelvic floor with little improvement since initial evaluation; however, she did well with diaphragmatic breathing to help improve relaxation. We discussed at length pain neuroscience education/hypersensitivity/pain processing and how being an active observer of sensations in her body is a great way to begin processing and desensitizing. There was some good carry over in abdominal release from last treatment session and she continued to tolerate release with bent knee fall out well. She will continue to benefit from skilled PT intervention in order to address impairments, decrease pain, and improve QOL.   ? PT Frequency 1x / week   ? PT Duration 12 weeks   ? PT Treatment/Interventions ADLs/Self Care Home Management;Biofeedback;Cryotherapy;Moist Heat;Therapeutic activities;Therapeutic exercise;Neuromuscular re-education;Manual techniques;Patient/family education;Scar mobilization;Passive range of motion;Dry needling;Spinal Manipulations   ? PT Next Visit Plan Plan to perform  abdominal manual techniques to improve mobility and decrease sensitivity; progress down training techniques and mobility exercises; consider return to pelvic floor release.   ? PT Home Exercise Plan [phone]   ? Consulted and Agree with Plan of Care Patient   ? ?  ?  ? ?  ? ? ?  Patient will benefit from skilled therapeutic intervention in order to improve the following deficits and impairments:  Decreased coordination, Decreased range of motion, Increased fascial restricitons, Impaired tone, Decreased endurance, Pain, Decreased activity tolerance, Decreased scar mobility, Hypomobility, Impaired flexibility, Postural dysfunction, Decreased strength, Decreased mobility ? ?Visit Diagnosis: ?Other muscle spasm ? ?Muscle weakness (generalized) ? ?Unspecified lack of coordination ? ? ? ? ?Problem List ?Patient Active Problem List  ? Diagnosis Date Noted  ? Endometriosis 10/25/2012  ? ? ?Heather Roberts, PT, DPT03/22/2310:17 AM ? ? ?[facility] ?[facility] ?[place], [place], [address] ?Phone: [phone]   Fax:  [phone] ? ?Name: Marylen Zuk ?MRN: [id number] ?Date of Birth: Oct 15, 1975 ? ? ? ?

## 2021-09-24 ENCOUNTER — Ambulatory Visit: Payer: BC Managed Care – PPO | Attending: Obstetrics and Gynecology

## 2021-09-24 DIAGNOSIS — M6281 Muscle weakness (generalized): Secondary | ICD-10-CM | POA: Diagnosis present

## 2021-09-24 DIAGNOSIS — R279 Unspecified lack of coordination: Secondary | ICD-10-CM | POA: Insufficient documentation

## 2021-09-24 DIAGNOSIS — M62838 Other muscle spasm: Secondary | ICD-10-CM | POA: Diagnosis present

## 2021-09-24 NOTE — Therapy (Addendum)
[facility], [place], [address] Phone: [phone]   Fax:  [phone]  Physical Therapy Treatment  Patient Details  Name: [name] MRN: [id number] Date of Birth: 05-09-1976 Referring Provider (PT): Illene Bolus, MD   Encounter Date: 09/24/2021   PT End of Session - 09/24/21 1102     Visit Number 4    Date for PT Re-Evaluation 10/15/21    Authorization Type BCBS    PT Start Time 1101    PT Stop Time 1142    PT Time Calculation (min) 41 min    Activity Tolerance Patient tolerated treatment well    Behavior During Therapy [facility] for tasks assessed/performed             Past Medical History:  Diagnosis Date   Endometriosis    Fibroids    Ileus, postoperative ([place])    Partial bowel obstruction ([place])     Past Surgical History:  Procedure Laterality Date   LAPAROSCOPIC ABDOMINAL EXPLORATION      There were no vitals filed for this visit.   Subjective Assessment - 09/24/21 1103     Subjective Pt states that pain has been descent this week. She feels like she is feeling pretty good today and does not have a migraine. She is currently a 4/10 pelvic pain and states that she would not have noticed it is not asked bout it.    Currently in Pain? Yes    Pain Score 4     Pain Location Pelvis    Pain Orientation Right;Left    Pain Descriptors / Indicators Pressure    Pain Type Chronic pain    Pain Radiating Towards into thighs and LB    Pain Onset More than a month ago    Pain Frequency Constant    Multiple Pain Sites No                               [facility] Adult PT Treatment/Exercise - 09/24/21 0001       Lumbar Exercises: Stretches   Other Lumbar Stretch Exercise Modified thomas stretch      Lumbar Exercises: Supine   Bridge 20 reps    Other Supine Lumbar Exercises LTR 3 x 10      Lumbar Exercises: Prone   Other Prone Lumbar Exercises Cobra 5 x 10 sec       Manual Therapy   Manual Therapy Myofascial release    Myofascial Release scar tissue mobilization; mobilization with movement to scar tissue with bent knee fall out                     PT Education - 09/24/21 1122     Education Details Pt education performed on HEP progressions.    Person(s) Educated Patient    Methods Explanation;Demonstration;Tactile cues;Verbal cues;Handout    Comprehension Verbalized understanding              PT Short Term Goals - 09/02/21 1331       PT SHORT TERM GOAL #1   Title Pt will be independent with HEP.    Time 4    Period Weeks    Status Partially Met    Target Date 08/20/21      PT SHORT TERM GOAL #2   Title Pt will be independent with mindfulness and diaphragmatic breathing in order to improve pelvic floor relaxatoin and  A/ROM.    Time 4    Period Weeks    Status Partially Met    Target Date 08/20/21               PT Long Term Goals - 09/02/21 1332       PT LONG TERM GOAL #1   Title Pt will be independent with advanced HEP.    Time 12    Period Weeks    Status Partially Met    Target Date 10/15/21      PT LONG TERM GOAL #2   Title Pt will reduce pelvic floor tightness in order to reduce dyspareunia and pain with orgasm by 75%.    Time 12    Period Weeks    Status Partially Met    Target Date 10/15/21      PT LONG TERM GOAL #3   Title Pt will report ability to stop taking pain medication and be able to perform exercises and self-manual techniques in order to provide pain management.    Time 12    Period Weeks    Status Partially Met    Target Date 10/15/21      PT LONG TERM GOAL #4   Title Pt will be able to demonstrate full pelvic floor A/ROM without pain in order to reduce pelvic floor relaxation and improve pelvic pain to no greater than 2/10.    Time 12    Period Weeks    Status Partially Met    Target Date 10/15/21                   Plan - 09/24/21 1105     Clinical Impression  Statement Pt is having a good today with relatively low pain levels. Therefore, she was able to progress stretches and begin gentle strengthening without any increase in pain. She does have more restriction in mobility exercises on Lt compared to Rt. Contiues to respond well to manual techniques with decrease in restriction. She will continue to benefit from skilled PT intervention in order to address impairments, decrease pain, and improve QOL.    PT Treatment/Interventions ADLs/Self Care Home Management;Biofeedback;Cryotherapy;Moist Heat;Therapeutic activities;Therapeutic exercise;Neuromuscular re-education;Manual techniques;Patient/family education;Scar mobilization;Passive range of motion;Dry needling;Spinal Manipulations    PT Next Visit Plan Plan to perform abdominal manual techniques to improve mobility and decrease sensitivity; progress down training techniques and mobility exercises; consider return to pelvic floor release.    PT Home Exercise Plan [phone]    Consulted and Agree with Plan of Care Patient             Patient will benefit from skilled therapeutic intervention in order to improve the following deficits and impairments:  Decreased coordination, Decreased range of motion, Increased fascial restricitons, Impaired tone, Decreased endurance, Pain, Decreased activity tolerance, Decreased scar mobility, Hypomobility, Impaired flexibility, Postural dysfunction, Decreased strength, Decreased mobility  Visit Diagnosis: Other muscle spasm  Muscle weakness (generalized)  Unspecified lack of coordination     Problem List Patient Active Problem List   Diagnosis Date Noted   Endometriosis 10/25/2012    Heather Roberts, PT, DPT04/11/2309:45 AM   [facility] [place], [place], [address] Phone: [phone]   Fax:  [phone]  Name: [name] MRN: [id number] Date of Birth: [birth year]  PHYSICAL  THERAPY DISCHARGE SUMMARY  Visits from Start of Care: 4  Current functional level related to goals / functional outcomes: Incomplete   Remaining deficits: See above  Education / Equipment: HEP   Patient agrees to discharge. Patient goals were partially met. Patient is being discharged due to not returning since the last visit.  Heather Roberts, PT, DPT06/10/2308:03 PM

## 2021-10-29 ENCOUNTER — Ambulatory Visit: Payer: BC Managed Care – PPO

## 2022-03-22 ENCOUNTER — Emergency Department (HOSPITAL_BASED_OUTPATIENT_CLINIC_OR_DEPARTMENT_OTHER): Payer: BC Managed Care – PPO

## 2022-03-22 ENCOUNTER — Inpatient Hospital Stay (HOSPITAL_BASED_OUTPATIENT_CLINIC_OR_DEPARTMENT_OTHER)
Admission: EM | Admit: 2022-03-22 | Discharge: 2022-03-25 | DRG: 389 | Disposition: A | Discharge status: Home / Self Care | Payer: BC Managed Care – PPO | Attending: Internal Medicine | Admitting: Internal Medicine

## 2022-03-22 ENCOUNTER — Encounter (HOSPITAL_BASED_OUTPATIENT_CLINIC_OR_DEPARTMENT_OTHER): Payer: Self-pay

## 2022-03-22 DIAGNOSIS — I10 Essential (primary) hypertension: Secondary | ICD-10-CM | POA: Diagnosis present

## 2022-03-22 DIAGNOSIS — Z8719 Personal history of other diseases of the digestive system: Secondary | ICD-10-CM | POA: Diagnosis not present

## 2022-03-22 DIAGNOSIS — K648 Other hemorrhoids: Secondary | ICD-10-CM | POA: Diagnosis present

## 2022-03-22 DIAGNOSIS — K561 Intussusception: Principal | ICD-10-CM

## 2022-03-22 DIAGNOSIS — G894 Chronic pain syndrome: Secondary | ICD-10-CM | POA: Diagnosis present

## 2022-03-22 DIAGNOSIS — Z79899 Other long term (current) drug therapy: Secondary | ICD-10-CM | POA: Diagnosis not present

## 2022-03-22 DIAGNOSIS — N809 Endometriosis, unspecified: Secondary | ICD-10-CM | POA: Diagnosis present

## 2022-03-22 DIAGNOSIS — K644 Residual hemorrhoidal skin tags: Secondary | ICD-10-CM | POA: Diagnosis present

## 2022-03-22 DIAGNOSIS — R102 Pelvic and perineal pain: Secondary | ICD-10-CM | POA: Diagnosis present

## 2022-03-22 DIAGNOSIS — Z888 Allergy status to other drugs, medicaments and biological substances status: Secondary | ICD-10-CM

## 2022-03-22 DIAGNOSIS — R1032 Left lower quadrant pain: Principal | ICD-10-CM

## 2022-03-22 DIAGNOSIS — M797 Fibromyalgia: Secondary | ICD-10-CM | POA: Diagnosis present

## 2022-03-22 DIAGNOSIS — Z79891 Long term (current) use of opiate analgesic: Secondary | ICD-10-CM | POA: Diagnosis not present

## 2022-03-22 DIAGNOSIS — Z23 Encounter for immunization: Secondary | ICD-10-CM

## 2022-03-22 DIAGNOSIS — Z8249 Family history of ischemic heart disease and other diseases of the circulatory system: Secondary | ICD-10-CM

## 2022-03-22 DIAGNOSIS — Z885 Allergy status to narcotic agent status: Secondary | ICD-10-CM | POA: Diagnosis not present

## 2022-03-22 DIAGNOSIS — K559 Vascular disorder of intestine, unspecified: Secondary | ICD-10-CM | POA: Diagnosis present

## 2022-03-22 DIAGNOSIS — E876 Hypokalemia: Secondary | ICD-10-CM | POA: Diagnosis present

## 2022-03-22 LAB — URINALYSIS, ROUTINE W REFLEX MICROSCOPIC
Bilirubin Urine: NEGATIVE
Glucose, UA: NEGATIVE mg/dL
Ketones, ur: NEGATIVE mg/dL
Leukocytes,Ua: NEGATIVE
Nitrite: NEGATIVE
Protein, ur: >=300 mg/dL — AB
Specific Gravity, Urine: 1.025 (ref 1.005–1.030)
pH: 5.5 (ref 5.0–8.0)

## 2022-03-22 LAB — COMPREHENSIVE METABOLIC PANEL
ALT: 16 U/L (ref 0–44)
AST: 17 U/L (ref 15–41)
Albumin: 3.8 g/dL (ref 3.5–5.0)
Alkaline Phosphatase: 43 U/L (ref 38–126)
Anion gap: 7 (ref 5–15)
BUN: 12 mg/dL (ref 6–20)
CO2: 23 mmol/L (ref 22–32)
Calcium: 9.1 mg/dL (ref 8.9–10.3)
Chloride: 109 mmol/L (ref 98–111)
Creatinine, Ser: 0.95 mg/dL (ref 0.44–1.00)
GFR, Estimated: >60 mL/min (ref >60)
Glucose, Bld: 117 mg/dL — ABNORMAL HIGH (ref 70–99)
Potassium: 4.1 mmol/L (ref 3.5–5.1)
Sodium: 139 mmol/L (ref 135–145)
Total Bilirubin: 0.4 mg/dL (ref 0.3–1.2)
Total Protein: 7.3 g/dL (ref 6.5–8.1)

## 2022-03-22 LAB — CBC
HCT: 38.8 % (ref 36.0–46.0)
Hemoglobin: 12.9 g/dL (ref 12.0–15.0)
MCH: 30.9 pg (ref 26.0–34.0)
MCHC: 33.2 g/dL (ref 30.0–36.0)
MCV: 92.8 fL (ref 80.0–100.0)
Platelets: 480 10*3/uL — ABNORMAL HIGH (ref 150–400)
RBC: 4.18 MIL/uL (ref 3.87–5.11)
RDW: 12.5 % (ref 11.5–15.5)
WBC: 10.3 10*3/uL (ref 4.0–10.5)
nRBC: 0 % (ref 0.0–0.2)

## 2022-03-22 LAB — URINALYSIS, MICROSCOPIC (REFLEX)

## 2022-03-22 LAB — HIV ANTIBODY (ROUTINE TESTING W REFLEX): HIV Screen 4th Generation wRfx: NONREACTIVE

## 2022-03-22 LAB — PREGNANCY, URINE: Preg Test, Ur: NEGATIVE

## 2022-03-22 LAB — LIPASE, BLOOD: Lipase: 28 U/L (ref 11–51)

## 2022-03-22 MED ORDER — HYDROMORPHONE HCL 1 MG/ML IJ SOLN
1.0000 mg | Freq: Once | INTRAMUSCULAR | Status: AC
  Administered 2022-03-22: 1 mg via INTRAVENOUS
  Filled 2022-03-22: qty 1

## 2022-03-22 MED ORDER — SODIUM CHLORIDE 0.9 % IV SOLN: INTRAVENOUS | Status: DC

## 2022-03-22 MED ORDER — HYDROMORPHONE HCL 1 MG/ML IJ SOLN
1.0000 mg | INTRAMUSCULAR | Status: DC | PRN
  Administered 2022-03-22 – 2022-03-24 (×7): 1 mg via INTRAVENOUS
  Filled 2022-03-22 (×8): qty 1

## 2022-03-22 MED ORDER — INFLUENZA VAC SPLIT QUAD 0.5 ML IM SUSY
0.5000 mL | PREFILLED_SYRINGE | INTRAMUSCULAR | Status: AC
  Administered 2022-03-25: 0.5 mL via INTRAMUSCULAR
  Filled 2022-03-22 (×2): qty 0.5

## 2022-03-22 MED ORDER — PROCHLORPERAZINE EDISYLATE 10 MG/2ML IJ SOLN
10.0000 mg | Freq: Four times a day (QID) | INTRAMUSCULAR | Status: DC | PRN
  Administered 2022-03-22: 10 mg via INTRAVENOUS
  Filled 2022-03-22 (×2): qty 2

## 2022-03-22 MED ORDER — SODIUM CHLORIDE 0.9 % IV BOLUS
1000.0000 mL | Freq: Once | INTRAVENOUS | Status: AC
  Administered 2022-03-22: 1000 mL via INTRAVENOUS

## 2022-03-22 MED ORDER — IOHEXOL 300 MG/ML  SOLN
100.0000 mL | Freq: Once | INTRAMUSCULAR | Status: AC | PRN
  Administered 2022-03-22: 100 mL via INTRAVENOUS

## 2022-03-22 MED ORDER — PEG 3350-KCL-NA BICARB-NACL 420 G PO SOLR
4000.0000 mL | Freq: Once | ORAL | Status: AC
  Administered 2022-03-22: 4000 mL via ORAL
  Filled 2022-03-22: qty 4000

## 2022-03-22 MED ORDER — ONDANSETRON HCL 4 MG/2ML IJ SOLN
4.0000 mg | Freq: Once | INTRAMUSCULAR | Status: AC
  Administered 2022-03-22: 4 mg via INTRAVENOUS
  Filled 2022-03-22: qty 2

## 2022-03-22 NOTE — ED Notes (Signed)
Patient returned from CT

## 2022-03-22 NOTE — Consult Note (Signed)
[facility] Surgery Consult Note  Rishika Mccollom September 24, 1975  [id number].    Requesting MD: Godfrey Pick Chief Complaint/Reason for Consult: abdominal pain  HPI:  [name] is a 46 y.o. female PMH endometriosis, fibromyalgia, and chronic pelvic pain on daily narcotics, who was transferred from [facility] to [facility] for evaluation of abdominal pain. Patient states that she woke up around 2am with severe left sided abdominal pain. She had a bowel movement just prior. She typically has a bowel movement every day or every other day, and does not take laxatives regularly. The pain is sharp and severe. Associated with nausea and vomiting. Denies fever or chills. No blood in stool. Feels similar to a prior SBO that was managed and resolve conservatively. No family h/o colon cancer.  In the ED she is found to be hemodynamically stable. WBC 10.3. CT abdomen/pelvis shows short-segment colo-colonic intussusception involving the transverse colon with suspected underlying mass lesion serving as a lead point; moderate stool burden throughout the colon extending to the splenic flexure with decompressed colon beyond the splenic flexure.  She did have a colonoscopy by [facility] in [place], Dr. Eusebio Friendly, 10/30/2020, that showed benign polyp in the ascending colon otherwise normal exam.  She has had multiple abdominal surgeries for her endometriosis: -Exploratory laparotomy, left salpingo-oophorectomy, lysis of  dense adhesions, right ovarian cystectomy x2, and removal of peritoneal endometriosis implants 01/2002 Dr. Lavona Mound -Exploratory laparotomy, lysis of adhesions, appendectomy, right ovarian cystectomy, revision of previous midline scar 02/2006 Dr. Dianah Field and Dr. Delsa Sale -Exploratory laparotomy, evacuation of the endometriotic  cyst, lysis of adhesions 02/2008 Dr. Delsa Sale -Exploratory laparoscopy, lysis of adhesions at [facility] unsure of MD name 09/2006    Family  History  Problem Relation Age of Onset   Hypertension Maternal Grandmother     Past Medical History:  Diagnosis Date   Endometriosis    Fibroids    Ileus, postoperative ([place])    Partial bowel obstruction ([place])     Past Surgical History:  Procedure Laterality Date   LAPAROSCOPIC ABDOMINAL EXPLORATION      Social History:  reports that she has never smoked. She has never used smokeless tobacco. She reports that she does not drink alcohol and does not use drugs.  Allergies:  Allergies  Allergen Reactions   Morphine And Related Hives   Reglan [Metoclopramide] Anxiety    (Not in a hospital admission)   Prior to Admission medications   Medication Sig Start Date End Date Taking? Authorizing Provider  amitriptyline (ELAVIL) 25 MG tablet TAKE 2TABS BY MOUTH AT BEDTIME (IF TOLERATED AFTER 1-2WEEKS CAN INCREASE TO 3TABS AT BEDTIME) Patient not taking: Reported on 08/06/2015 02/20/14   Lahoma Crocker, MD  DULoxetine (CYMBALTA) 60 MG capsule Take 60 mg by mouth daily.    [provider]  Multiple Vitamin (MULTIVITAMIN WITH MINERALS) TABS tablet Take 1 tablet by mouth daily.    [provider]  Norethindrone Acetate (AYGESTIN PO) Take by mouth.    [provider]  oxyCODONE-acetaminophen (PERCOCET) 10-325 MG per tablet Take 1 tablet by mouth 3 (three) times daily as needed for pain. 07/03/14   Shelly Bombard, MD  oxymetazoline (AFRIN) 0.05 % nasal spray Place 1 spray into both nostrils 2 (two) times daily as needed for congestion.    [provider]  promethazine (PHENERGAN) 12.5 MG tablet TAKE 2 TABLETS BY MOUTH EVERY 8 HOURS AS NEEDED. 04/12/14   Lahoma Crocker, MD  Pseudoephedrine HCl (SUDAFED 24 HOUR PO) Take 1 tablet by mouth  daily as needed. For congestion    [provider]  zolpidem (AMBIEN) 10 MG tablet TAKE 1 TABLET BY MOUTH AT BEDTIME AS NEEDED 05/26/14   Lahoma Crocker, MD    Blood pressure 136/78, pulse 92,  temperature 98.7 F (37.1 C), resp. rate 17, height '5\' 6"'$  (1.676 m), weight 69.4 kg, SpO2 95 %. Physical Exam: General: pleasant, WD/WN female who is laying in bed in NAD but appears uncomfortable HEENT: head is normocephalic, atraumatic.  Sclera are noninjected.  Pupils equal and round.  Ears and nose without any masses or lesions.  Mouth is pink and moist. Dentition fair Heart: regular, rate, and rhythm Lungs: CTAB, no wheezes, rhonchi, or rales noted.  Respiratory effort nonlabored Abd: well healed laparotomy incision, soft, ND, +BS, no masses, hernias, or organomegaly. Focally tender in the left abdomen with guarding, no diffuse peritonitis MS: no BUE/BLE edema, calves soft and nontender Skin: warm and dry with no masses, lesions, or rashes Psych: A&Ox4 with an appropriate affect Neuro: MAEs, no gross motor or sensory deficits BUE/BLE  Results for orders placed or performed during the hospital encounter of 03/22/22 (from the past 48 hour(s))  Lipase, blood     Status: None   Collection Time: 03/22/22  8:46 AM  Result Value Ref Range   Lipase 28 11 - 51 U/L    Comment: Performed at [facility], [address] [place], [place] [address]  Comprehensive metabolic panel     Status: Abnormal   Collection Time: 03/22/22  8:46 AM  Result Value Ref Range   Sodium 139 135 - 145 mmol/L   Potassium 4.1 3.5 - 5.1 mmol/L   Chloride 109 98 - 111 mmol/L   CO2 23 22 - 32 mmol/L   Glucose, Bld 117 (H) 70 - 99 mg/dL    Comment: Glucose reference range applies only to samples taken after fasting for at least 8 hours.   BUN 12 6 - 20 mg/dL   Creatinine, Ser 0.95 0.44 - 1.00 mg/dL   Calcium 9.1 8.9 - 10.3 mg/dL   Total Protein 7.3 6.5 - 8.1 g/dL   Albumin 3.8 3.5 - 5.0 g/dL   AST 17 15 - 41 U/L   ALT 16 0 - 44 U/L   Alkaline Phosphatase 43 38 - 126 U/L   Total Bilirubin 0.4 0.3 - 1.2 mg/dL   GFR, Estimated >60 >60 mL/min    Comment: (NOTE) Calculated using the CKD-EPI Creatinine  Equation (2021)    Anion gap 7 5 - 15    Comment: Performed at [facility], [address] [place], [place] [address]  CBC     Status: Abnormal   Collection Time: 03/22/22  8:46 AM  Result Value Ref Range   WBC 10.3 4.0 - 10.5 K/uL   RBC 4.18 3.87 - 5.11 MIL/uL   Hemoglobin 12.9 12.0 - 15.0 g/dL   HCT 38.8 36.0 - 46.0 %   MCV 92.8 80.0 - 100.0 fL   MCH 30.9 26.0 - 34.0 pg   MCHC 33.2 30.0 - 36.0 g/dL   RDW 12.5 11.5 - 15.5 %   Platelets 480 (H) 150 - 400 K/uL   nRBC 0.0 0.0 - 0.2 %    Comment: Performed at [facility], [address] [place], [place] [address]  Urinalysis, Routine w reflex microscopic Urine, Clean Catch     Status: Abnormal   Collection Time: 03/22/22  9:30 AM  Result Value  Ref Range   Color, Urine YELLOW YELLOW   APPearance CLEAR CLEAR   Specific Gravity, Urine 1.025 1.005 - 1.030   pH 5.5 5.0 - 8.0   Glucose, UA NEGATIVE NEGATIVE mg/dL   Hgb urine dipstick MODERATE (A) NEGATIVE   Bilirubin Urine NEGATIVE NEGATIVE   Ketones, ur NEGATIVE NEGATIVE mg/dL   Protein, ur >=300 (A) NEGATIVE mg/dL   Nitrite NEGATIVE NEGATIVE   Leukocytes,Ua NEGATIVE NEGATIVE    Comment: Performed at [facility], [address] [place], [place] [address]  Pregnancy, urine     Status: None   Collection Time: 03/22/22  9:30 AM  Result Value Ref Range   Preg Test, Ur NEGATIVE NEGATIVE    Comment:        THE SENSITIVITY OF THIS METHODOLOGY IS >20 mIU/mL. Performed at [facility], [address] [place], [place] [address]   Urinalysis, Microscopic (reflex)     Status: Abnormal   Collection Time: 03/22/22  9:30 AM  Result Value Ref Range   RBC / HPF 11-20 0 - 5 RBC/hpf   WBC, UA 0-5 0 - 5 WBC/hpf   Bacteria, UA FEW (A) NONE SEEN   Squamous Epithelial / LPF 0-5 0 - 5    Comment: Performed at [facility], [address] [place], [place] [address]   CT Abdomen Pelvis W Contrast  Result Date: 03/22/2022 CLINICAL DATA:   Nausea, vomiting EXAM: CT ABDOMEN AND PELVIS WITH CONTRAST TECHNIQUE: Multidetector CT imaging of the abdomen and pelvis was performed using the standard protocol following bolus administration of intravenous contrast. RADIATION DOSE REDUCTION: This exam was performed according to the departmental dose-optimization program which includes automated exposure control, adjustment of the mA and/or kV according to patient size and/or use of iterative reconstruction technique. CONTRAST:  176m OMNIPAQUE IOHEXOL 300 MG/ML  SOLN COMPARISON:  CT abdomen/pelvis 07/02/2013 FINDINGS: Lower chest: The lung bases are clear. The imaged heart is unremarkable. Hepatobiliary: The liver and gallbladder are unremarkable. There is no biliary ductal dilatation. Pancreas: Unremarkable. Spleen: Unremarkable. Adrenals/Urinary Tract: Adrenals are unremarkable. The kidneys are unremarkable, with no focal lesion, stone, hydronephrosis, or hydroureter. Stomach/Bowel: The stomach is unremarkable. There is a moderate stool burden throughout the colon. There is short-segment colo-colonic intussusception involving the transverse colon with a suspected underlying mass lesion serving as a lead point (2-56, 5-52). The colon beyond the splenic flexure is decompressed. Vascular/Lymphatic: The abdominal aorta is normal in course and caliber. The major branch vessels are patent. The main portal and splenic veins are patent. There is no abdominopelvic lymphadenopathy. Reproductive: The uterus and adnexa are unremarkable. Other: There is no ascites or free air. Musculoskeletal: There is no acute osseous abnormality or suspicious osseous lesion. IMPRESSION: 1. Short-segment colo-colonic intussusception involving the transverse colon with suspected underlying mass lesion serving as a lead point. Recommend correlation with colonoscopy. 2. Moderate stool burden throughout the colon extending to the splenic flexure with decompressed colon beyond the splenic  flexure. Electronically Signed   By: PValetta MoleM.D.   On: 03/22/2022 10:56   DG Chest Port 1 View  Result Date: 03/22/2022 CLINICAL DATA:  Left lower quadrant abdominal pain. EXAM: PORTABLE CHEST 1 VIEW COMPARISON:  06/09/2007 FINDINGS: Grossly unchanged cardiac silhouette and mediastinal contours given AP projection and portable technique. No focal airspace opacities. No pleural effusion or pneumothorax. No evidence of edema. No acute osseous abnormalities. IMPRESSION: No acute cardiopulmonary disease. Electronically Signed   By: JJenny Reichmann  Watts M.D.   On: 03/22/2022 09:23      Assessment/Plan Left sided abdominal pain, nausea, vomiting ?Colonic intussusception involving the transverse colon Constipation - CT abdomen/pelvis reports short-segment colo-colonic intussusception involving the transverse colon with suspected underlying mass lesion serving as a lead point; moderate stool burden throughout the colon extending to the splenic flexure with decompressed colon beyond the splenic flexure - last colonoscopy 10/30/20 by Dr. Eusebio Friendly, report scanned into chart - WBC WNL, VSS. No indication for acute surgical intervention. The location of the patient's pain is not fully consistent with what is seen on CT scan. Unsure if this is the cause of her pain, or possibly constipation or endometriosis? Recommend medical admission. GI consult for consideration of endoscopic evaluation. We will follow.  ID - none VTE - ok for chemical dvt ppx from surgical standpoint FEN - IVF, NPO Foley - none  Endometriosis s/p multiple prior surgeries (listed above) Fibromyalgia Chronic pelvic pain on daily oxycodone '5mg'$  TID and muscle relaxers  I reviewed ED provider notes, last 24 h vitals and pain scores, last 48 h intake and output, last 24 h labs and trends, and last 24 h imaging results.   Wellington Hampshire, [facility] 03/22/2022, 2:13 PM Please see Amion for pager number during day  hours 7:00am-4:30pm

## 2022-03-22 NOTE — ED Notes (Signed)
Patient transported to CT 

## 2022-03-22 NOTE — ED Notes (Signed)
Phone report given to Charge RN at [facility] ED , spoke with [employer] RN

## 2022-03-22 NOTE — ED Notes (Signed)
Tx via POV to [facility] ED for further evaluation of GI issues, with surgical consult, VSS, ambulated with min assistance, alert and oriented, pain under control at this time, No issues currently with N/V

## 2022-03-22 NOTE — ED Notes (Signed)
Client tx to [facility] via [place] per ED MD verbal orders, IV in Left AC secured with kerlix. Family at bedside to drive client to next ED. Dr Vanita Panda, MD accepting pt.

## 2022-03-22 NOTE — ED Provider Notes (Addendum)
Care of patient assumed from Dr. Melina Copa following transfer from [facility].  This patient presented to the ED initially for lower abdominal cramping pain starting at 2 AM.  She had diarrhea followed by severe worsening of left lower quadrant pain.  CT scan showed concern of intussusception.  She was transferred to [facility] long for general surgery evaluation.  [place], she received 1 L IVF, Zofran, and 2 doses of Dilaudid.  Last dose of pain medication was 1147. Physical Exam  BP 137/83 (BP Location: Right Arm)   Pulse 96   Temp 99.2 F (37.3 C)   Resp 18   Ht '5\' 6"'$  (1.676 m)   Wt 69.4 kg   SpO2 95%   BMI 24.69 kg/m   Physical Exam Vitals and nursing note reviewed.  Constitutional:      General: She is not in acute distress.    Appearance: She is well-developed.  HENT:     Head: Normocephalic and atraumatic.     Mouth/Throat:     Mouth: Mucous membranes are moist.     Pharynx: Oropharynx is clear.  Eyes:     Extraocular Movements: Extraocular movements intact.     Conjunctiva/sclera: Conjunctivae normal.  Cardiovascular:     Rate and Rhythm: Normal rate and regular rhythm.     Heart sounds: No murmur heard. Pulmonary:     Effort: Pulmonary effort is normal. No respiratory distress.  Abdominal:     Palpations: Abdomen is soft.     Tenderness: There is abdominal tenderness in the left upper quadrant and left lower quadrant.  Musculoskeletal:        General: No swelling.     Cervical back: Neck supple.  Skin:    General: Skin is warm and dry.     Capillary Refill: Capillary refill takes less than 2 seconds.  Neurological:     General: No focal deficit present.     Mental Status: She is alert and oriented to person, place, and time.  Psychiatric:        Mood and Affect: Mood normal.        Behavior: Behavior normal.     Procedures  Procedures  ED Course / MDM   Clinical Course as of 03/22/22 1304  Mon Mar 22, 2022  0926 Chest x-ray  interpreted by me as no acute infiltrates no free air.  Awaiting radiology reading. [MB]  1135 Discussed with PA Meuth general surgery.  She asked the patient be transported ED to ED for general surgery evaluation.  She does not feel the patient needs antibiotics currently.  Reviewed with patient and sister.  She wishes to go by private vehicle sister will drive her.  She understands not to eat or drink and to go directly there.  IV will be secured. [MB]    Clinical Course User Index [MB] Hayden Rasmussen, MD   Medical Decision Making Amount and/or Complexity of Data Reviewed Labs: ordered. Radiology: ordered.  Risk Prescription drug management. Decision regarding hospitalization.   Vital signs on arrival notable for mild hypertension.  Patient is well-appearing on exam.  She endorses continued pain in the left side of her abdomen.  There is tenderness present.  Dilaudid ordered for ongoing analgesia.  General surgery was notified of her arrival in the ED.  They came and evaluated her at bedside.  They requested GI consult and hospitalist admission.  Surgery will continue to follow in consult.  GI was consulted.  Patient was  admitted to hospitalist for further management.       Godfrey Pick, MD 03/22/22 Erskin Burnet    Godfrey Pick, MD 03/22/22 [phone]

## 2022-03-22 NOTE — ED Provider Notes (Signed)
[facility] Provider Note   CSN: [id number] Arrival date & time: 03/22/22  0830     History  Chief Complaint  Patient presents with   Abdominal Pain    [name] is a 46 y.o. female.  She has a history of endometriosis and bowel obstruction, multiple abdominal surgeries.  Complaining of some low abdominal cramps that started around 2 AM.  She had some loose bowel movements followed by severe left lower quadrant abdominal pain.  Sharp stabbing in nature.  It feels similar to prior bowel obstructions.  Since then she has had no stool or gas by rectum and has had multiple episodes of vomiting associated nausea.  No fevers or chills.  No urinary symptoms.  She denies chance of pregnancy and is on hormonal treatment.  The history is provided by the patient.  Abdominal Pain Pain location:  LLQ Pain quality: sharp and stabbing   Pain severity:  Severe Onset quality:  Sudden Duration:  6 hours Timing:  Constant Progression:  Unchanged Chronicity:  Recurrent Context: not sick contacts, not suspicious food intake and not trauma   Relieved by:  Nothing Worsened by:  Nothing Ineffective treatments:  Not moving and position changes Associated symptoms: nausea and vomiting   Associated symptoms: no chest pain, no chills, no constipation, no cough, no diarrhea, no dysuria, no fever, no hematemesis, no hematochezia, no hematuria, no shortness of breath, no sore throat, no vaginal bleeding and no vaginal discharge   Risk factors: multiple surgeries        Home Medications Prior to Admission medications   Medication Sig Start Date End Date Taking? Authorizing Provider  amitriptyline (ELAVIL) 25 MG tablet TAKE 2TABS BY MOUTH AT BEDTIME (IF TOLERATED AFTER 1-2WEEKS CAN INCREASE TO 3TABS AT BEDTIME) Patient not taking: Reported on 08/06/2015 02/20/14   Lahoma Crocker, MD  DULoxetine (CYMBALTA) 60 MG capsule Take 60 mg by mouth daily.    [provider]  Multiple Vitamin (MULTIVITAMIN WITH MINERALS) TABS tablet Take 1 tablet by mouth daily.    [provider]  Norethindrone Acetate (AYGESTIN PO) Take by mouth.    [provider]  oxyCODONE-acetaminophen (PERCOCET) 10-325 MG per tablet Take 1 tablet by mouth 3 (three) times daily as needed for pain. 07/03/14   Shelly Bombard, MD  oxymetazoline (AFRIN) 0.05 % nasal spray Place 1 spray into both nostrils 2 (two) times daily as needed for congestion.    [provider]  promethazine (PHENERGAN) 12.5 MG tablet TAKE 2 TABLETS BY MOUTH EVERY 8 HOURS AS NEEDED. 04/12/14   Lahoma Crocker, MD  Pseudoephedrine HCl (SUDAFED 24 HOUR PO) Take 1 tablet by mouth daily as needed. For congestion    [provider]  zolpidem (AMBIEN) 10 MG tablet TAKE 1 TABLET BY MOUTH AT BEDTIME AS NEEDED 05/26/14   Lahoma Crocker, MD      Allergies    Morphine and related and Reglan [metoclopramide]    Review of Systems   Review of Systems  Constitutional:  Negative for chills and fever.  HENT:  Negative for sore throat.   Respiratory:  Negative for cough and shortness of breath.   Cardiovascular:  Negative for chest pain.  Gastrointestinal:  Positive for abdominal pain, nausea and vomiting. Negative for constipation, diarrhea, hematemesis and hematochezia.  Genitourinary:  Negative for dysuria, hematuria, vaginal bleeding and vaginal discharge.  Skin:  Negative for rash.    Physical Exam Updated Vital Signs BP (!) 161/115   Pulse  97   Temp 99.2 F (37.3 C)   Resp 19   Ht '5\' 6"'$  (1.676 m)   Wt 69.4 kg   BMI 24.69 kg/m  Physical Exam Vitals and nursing note reviewed.  Constitutional:      General: She is not in acute distress.    Appearance: Normal appearance. She is well-developed.  HENT:     Head: Normocephalic and atraumatic.  Eyes:     Conjunctiva/sclera: Conjunctivae normal.  Cardiovascular:     Rate and Rhythm: Normal rate and regular  rhythm.     Heart sounds: No murmur heard. Pulmonary:     Effort: Pulmonary effort is normal. No respiratory distress.     Breath sounds: Normal breath sounds.  Abdominal:     General: There are no signs of injury.     Tenderness: There is generalized abdominal tenderness. There is guarding.     Hernia: No hernia is present.  Musculoskeletal:        General: No swelling.     Cervical back: Neck supple.  Skin:    General: Skin is warm and dry.     Capillary Refill: Capillary refill takes less than 2 seconds.  Neurological:     General: No focal deficit present.     Mental Status: She is alert.     ED Results / Procedures / Treatments   Labs (all labs ordered are listed, but only abnormal results are displayed) Labs Reviewed  COMPREHENSIVE METABOLIC PANEL - Abnormal; Notable for the following components:      Result Value   Glucose, Bld 117 (*)    All other components within normal limits  CBC - Abnormal; Notable for the following components:   Platelets 480 (*)    All other components within normal limits  URINALYSIS, ROUTINE W REFLEX MICROSCOPIC - Abnormal; Notable for the following components:   Hgb urine dipstick MODERATE (*)    Protein, ur >=300 (*)    All other components within normal limits  URINALYSIS, MICROSCOPIC (REFLEX) - Abnormal; Notable for the following components:   Bacteria, UA FEW (*)    All other components within normal limits  LIPASE, BLOOD  PREGNANCY, URINE    EKG None  Radiology CT Abdomen Pelvis W Contrast  Result Date: 03/22/2022 CLINICAL DATA:  Nausea, vomiting EXAM: CT ABDOMEN AND PELVIS WITH CONTRAST TECHNIQUE: Multidetector CT imaging of the abdomen and pelvis was performed using the standard protocol following bolus administration of intravenous contrast. RADIATION DOSE REDUCTION: This exam was performed according to the departmental dose-optimization program which includes automated exposure control, adjustment of the mA and/or kV  according to patient size and/or use of iterative reconstruction technique. CONTRAST:  163m OMNIPAQUE IOHEXOL 300 MG/ML  SOLN COMPARISON:  CT abdomen/pelvis 07/02/2013 FINDINGS: Lower chest: The lung bases are clear. The imaged heart is unremarkable. Hepatobiliary: The liver and gallbladder are unremarkable. There is no biliary ductal dilatation. Pancreas: Unremarkable. Spleen: Unremarkable. Adrenals/Urinary Tract: Adrenals are unremarkable. The kidneys are unremarkable, with no focal lesion, stone, hydronephrosis, or hydroureter. Stomach/Bowel: The stomach is unremarkable. There is a moderate stool burden throughout the colon. There is short-segment colo-colonic intussusception involving the transverse colon with a suspected underlying mass lesion serving as a lead point (2-56, 5-52). The colon beyond the splenic flexure is decompressed. Vascular/Lymphatic: The abdominal aorta is normal in course and caliber. The major branch vessels are patent. The main portal and splenic veins are patent. There is no abdominopelvic lymphadenopathy. Reproductive: The uterus and adnexa are unremarkable. Other:  There is no ascites or free air. Musculoskeletal: There is no acute osseous abnormality or suspicious osseous lesion. IMPRESSION: 1. Short-segment colo-colonic intussusception involving the transverse colon with suspected underlying mass lesion serving as a lead point. Recommend correlation with colonoscopy. 2. Moderate stool burden throughout the colon extending to the splenic flexure with decompressed colon beyond the splenic flexure. Electronically Signed   By: Valetta Mole M.D.   On: 03/22/2022 10:56   DG Chest Port 1 View  Result Date: 03/22/2022 CLINICAL DATA:  Left lower quadrant abdominal pain. EXAM: PORTABLE CHEST 1 VIEW COMPARISON:  06/09/2007 FINDINGS: Grossly unchanged cardiac silhouette and mediastinal contours given AP projection and portable technique. No focal airspace opacities. No pleural effusion or  pneumothorax. No evidence of edema. No acute osseous abnormalities. IMPRESSION: No acute cardiopulmonary disease. Electronically Signed   By: Sandi Mariscal M.D.   On: 03/22/2022 09:23    Procedures Procedures    Medications Ordered in ED Medications  HYDROmorphone (DILAUDID) injection 1 mg (has no administration in time range)  ondansetron (ZOFRAN) injection 4 mg (has no administration in time range)  sodium chloride 0.9 % bolus 1,000 mL (has no administration in time range)    ED Course/ Medical Decision Making/ A&P Clinical Course as of 03/22/22 1728  Mon Mar 22, 2022  0926 Chest x-ray interpreted by me as no acute infiltrates no free air.  Awaiting radiology reading. [MB]  1135 Discussed with PA Meuth general surgery.  She asked the patient be transported ED to ED for general surgery evaluation.  She does not feel the patient needs antibiotics currently.  Reviewed with patient and sister.  She wishes to go by private vehicle sister will drive her.  She understands not to eat or drink and to go directly there.  IV will be secured. [MB]    Clinical Course User Index [MB] Hayden Rasmussen, MD                           Medical Decision Making Amount and/or Complexity of Data Reviewed Labs: ordered. Radiology: ordered.  Risk Prescription drug management. Decision regarding hospitalization.   This patient complains of diarrhea, nausea and vomiting, left lower quadrant abdominal pain; this involves an extensive number of treatment Options and is a complaint that carries with it a high risk of complications and morbidity. The differential includes diverticulitis, colitis, perforation, abscess, obstruction  I ordered, reviewed and interpreted labs, which included CBC with normal white count normal hemoglobin, chemistries and LFTs normal, urinalysis without clear signs of infection, pregnancy test negative I ordered medication IV fluids and pain medication and reviewed PMP when  indicated. I ordered imaging studies which included chest x-ray and CT abdomen and pelvis and I independently    visualized and interpreted imaging which showed large stool burden, area of intussusception large bowel Additional history obtained from patient's sister Previous records obtained and reviewed in epic no recent admissions I consulted general surgery Dr. Windle Guard and discussed lab and imaging findings and discussed disposition.  Cardiac monitoring reviewed, normal sinus rhythm Social determinants considered, no significant barriers Critical Interventions: None  After the interventions stated above, I reevaluated the patient and found patient's pain to be improved although not resolved Admission and further testing considered, she would benefit from general surgery evaluation.  Patient in agreement with plan for transfer over to [facility] for surgical evaluation.         Final Clinical Impression(s) / ED Diagnoses  Final diagnoses:  Abdominal pain, left lower quadrant  Intussusception [facility])    Rx / DC Orders ED Discharge Orders     None         Hayden Rasmussen, MD 03/22/22 1732

## 2022-03-22 NOTE — H&P (View-Only) (Signed)
Referring Provider: [facility] Primary Care Physician:  Selinda Orion Primary Gastroenterologist:  Althia Forts (Dr. Cindee Salt)  Reason for Consultation: Abnormal CT scan  HPI: [name] is a 46 y.o. female with past medical history as of [name]'s, fibromyalgia, pelvic pain, who presented to the emergency room for evaluation of nausea, vomiting, abdominal pain.  Patient reports sudden onset abdominal pain, nausea, vomiting yesterday.  She started vomiting "every other minute", emesis was no bloody, given severity of abdominal pain, nausea and vomiting she decided to present to the emergency room.  Notes her pain was achy in her lower abdomen.  She has had no further episodes of vomiting since presenting to the ED, her abdominal pain is also improved.  Reports she has had 4 prior episodes similar to this, she has not had any evaluation for her nausea, vomiting, abdominal pain.  Reports her last bowel movement was this morning with small amount of red blood in her stool.  Denies melena.  She has flatulence today.  Last colonoscopy 10/30/2020 with Dr. Eusebio Friendly showing benign polyp in the ascending colon, otherwise normal.  Denies blood thinning medication, denies alcohol use, smoking, NSAID use.   She has history of multiple previous abdominal surgeries for endometriosis.  In the ED, normal lipase, no anemia or leukocytosis, vital signs stable, CT scan with findings of short segment colocolonic intussusception involving the transverse colon with suspected underlying mass lesion as well as moderate stool burden.    Past Medical History:  Diagnosis Date   Endometriosis    Fibroids    Ileus, postoperative (HCC)    Partial bowel obstruction (HCC)     Past Surgical History:  Procedure Laterality Date   LAPAROSCOPIC ABDOMINAL EXPLORATION      Prior to Admission medications   Medication Sig Start Date End Date Taking? Authorizing Provider  amitriptyline (ELAVIL) 25 MG tablet TAKE  2TABS BY MOUTH AT BEDTIME (IF TOLERATED AFTER 1-2WEEKS CAN INCREASE TO 3TABS AT BEDTIME) Patient not taking: Reported on 08/06/2015 02/20/14   Lahoma Crocker, MD  DULoxetine (CYMBALTA) 60 MG capsule Take 60 mg by mouth daily.    [provider]  Multiple Vitamin (MULTIVITAMIN WITH MINERALS) TABS tablet Take 1 tablet by mouth daily.    [provider]  Norethindrone Acetate (AYGESTIN PO) Take by mouth.    [provider]  oxyCODONE-acetaminophen (PERCOCET) 10-325 MG per tablet Take 1 tablet by mouth 3 (three) times daily as needed for pain. 07/03/14   Shelly Bombard, MD  oxymetazoline (AFRIN) 0.05 % nasal spray Place 1 spray into both nostrils 2 (two) times daily as needed for congestion.    [provider]  promethazine (PHENERGAN) 12.5 MG tablet TAKE 2 TABLETS BY MOUTH EVERY 8 HOURS AS NEEDED. 04/12/14   Lahoma Crocker, MD  Pseudoephedrine HCl (SUDAFED 24 HOUR PO) Take 1 tablet by mouth daily as needed. For congestion    [provider]  zolpidem (AMBIEN) 10 MG tablet TAKE 1 TABLET BY MOUTH AT BEDTIME AS NEEDED 05/26/14   Lahoma Crocker, MD    Scheduled Meds:  polyethylene glycol-electrolytes  4,000 mL Oral Once   Continuous Infusions: PRN Meds:.  Allergies as of 03/22/2022 - Review Complete 03/22/2022  Allergen Reaction Noted   Morphine and related Hives 10/25/2012   Reglan [metoclopramide] Anxiety 10/25/2012    Family History  Problem Relation Age of Onset   Hypertension Maternal Grandmother     Social History   Socioeconomic History   Marital status: Married  Spouse name: Not on file   Number of children: Not on file   Years of education: Not on file   Highest education level: Not on file  Occupational History   Not on file  Tobacco Use   Smoking status: Never   Smokeless tobacco: Never  Substance and Sexual Activity   Alcohol use: No   Drug use: No   Sexual activity: Yes    Birth control/protection: Pill   Other Topics Concern   Not on file  Social History Narrative   Not on file   Social Determinants of Health   Financial Resource Strain: Not on file  Food Insecurity: Not on file  Transportation Needs: Not on file  Physical Activity: Not on file  Stress: Not on file  Social Connections: Not on file  Intimate Partner Violence: Not on file    Review of Systems: All negative except as stated above in HPI.  Physical Exam:Physical Exam Constitutional:      Appearance: Normal appearance. She is normal weight. She is ill-appearing.  HENT:     Head: Normocephalic and atraumatic.     Right Ear: External ear normal.     Left Ear: External ear normal.     Nose: Nose normal.     Mouth/Throat:     Mouth: Mucous membranes are moist.  Eyes:     General: No scleral icterus.    Pupils: Pupils are equal, round, and reactive to light.  Cardiovascular:     Rate and Rhythm: Normal rate and regular rhythm.     Pulses: Normal pulses.     Heart sounds: Normal heart sounds.  Pulmonary:     Effort: Pulmonary effort is normal.     Breath sounds: Normal breath sounds.  Abdominal:     General: Abdomen is flat. Bowel sounds are normal. There is no distension.     Palpations: Abdomen is soft. There is no mass.     Tenderness: There is abdominal tenderness (lower abdomen). There is guarding. There is no rebound.     Hernia: No hernia is present.  Musculoskeletal:        General: Normal range of motion.     Cervical back: Normal range of motion and neck supple.  Skin:    General: Skin is warm and dry.  Neurological:     General: No focal deficit present.     Mental Status: She is alert and oriented to person, place, and time. Mental status is at baseline.  Psychiatric:        Mood and Affect: Mood normal.        Behavior: Behavior normal.     Vital signs: Vitals:   03/22/22 1355 03/22/22 1500  BP:  (!) 153/84  Pulse: 92 88  Resp:  17  Temp:    SpO2: 95% 97%        GI:  Lab  Results: Recent Labs    03/22/22 0846  WBC 10.3  HGB 12.9  HCT 38.8  PLT 480*   BMET Recent Labs    03/22/22 0846  NA 139  K 4.1  CL 109  CO2 23  GLUCOSE 117*  BUN 12  CREATININE 0.95  CALCIUM 9.1   LFT Recent Labs    03/22/22 0846  PROT 7.3  ALBUMIN 3.8  AST 17  ALT 16  ALKPHOS 43  BILITOT 0.4   PT/INR No results for input(s): "LABPROT", "INR" in the last 72 hours.   Studies/Results: CT Abdomen Pelvis W Contrast  Result Date: 03/22/2022 CLINICAL DATA:  Nausea, vomiting EXAM: CT ABDOMEN AND PELVIS WITH CONTRAST TECHNIQUE: Multidetector CT imaging of the abdomen and pelvis was performed using the standard protocol following bolus administration of intravenous contrast. RADIATION DOSE REDUCTION: This exam was performed according to the departmental dose-optimization program which includes automated exposure control, adjustment of the mA and/or kV according to patient size and/or use of iterative reconstruction technique. CONTRAST:  177m OMNIPAQUE IOHEXOL 300 MG/ML  SOLN COMPARISON:  CT abdomen/pelvis 07/02/2013 FINDINGS: Lower chest: The lung bases are clear. The imaged heart is unremarkable. Hepatobiliary: The liver and gallbladder are unremarkable. There is no biliary ductal dilatation. Pancreas: Unremarkable. Spleen: Unremarkable. Adrenals/Urinary Tract: Adrenals are unremarkable. The kidneys are unremarkable, with no focal lesion, stone, hydronephrosis, or hydroureter. Stomach/Bowel: The stomach is unremarkable. There is a moderate stool burden throughout the colon. There is short-segment colo-colonic intussusception involving the transverse colon with a suspected underlying mass lesion serving as a lead point (2-56, 5-52). The colon beyond the splenic flexure is decompressed. Vascular/Lymphatic: The abdominal aorta is normal in course and caliber. The major branch vessels are patent. The main portal and splenic veins are patent. There is no abdominopelvic lymphadenopathy.  Reproductive: The uterus and adnexa are unremarkable. Other: There is no ascites or free air. Musculoskeletal: There is no acute osseous abnormality or suspicious osseous lesion. IMPRESSION: 1. Short-segment colo-colonic intussusception involving the transverse colon with suspected underlying mass lesion serving as a lead point. Recommend correlation with colonoscopy. 2. Moderate stool burden throughout the colon extending to the splenic flexure with decompressed colon beyond the splenic flexure. Electronically Signed   By: PValetta MoleM.D.   On: 03/22/2022 10:56   DG Chest Port 1 View  Result Date: 03/22/2022 CLINICAL DATA:  Left lower quadrant abdominal pain. EXAM: PORTABLE CHEST 1 VIEW COMPARISON:  06/09/2007 FINDINGS: Grossly unchanged cardiac silhouette and mediastinal contours given AP projection and portable technique. No focal airspace opacities. No pleural effusion or pneumothorax. No evidence of edema. No acute osseous abnormalities. IMPRESSION: No acute cardiopulmonary disease. Electronically Signed   By: JSandi MariscalM.D.   On: 03/22/2022 09:23    Impression: Abdominal pain, nausea, vomiting Abnormal CT scan Constipation  Patient with sudden onset nausea, vomiting, abdominal pain, abdominal pain and vomiting improved at this time with IV fluids, Zofran, Dilaudid.  Patient reports this happened several times prior.  CT scan with findings of short segment colo-colonic intussusception with suspected underlying mass.  Last colonoscopy 10/2020, patient receives colonoscopies every 5 years since she was 89  Possible abdominal pain, nausea, vomiting secondary to intussusception.  Abdominal pain, nausea, vomiting may be worsened by significant constipation.  Patient is on Percocet 3 times daily. concern for underlying mass patient would benefit from further evaluation with colonoscopy.  CT abdomen pelvis with contrast 03/22/2022 1. Short-segment colo-colonic intussusception involving the transverse  colon with suspected underlying mass lesion serving as a lead point. Recommend correlation with colonoscopy. 2. Moderate stool burden throughout the colon extending to the splenic flexure with decompressed colon beyond the splenic flexure.   Plan: Plan for Colonoscopy tomorrow. I thoroughly discussed the procedures to include nature, alternatives, benefits, and risks including but not limited to bleeding, perforation, infection, anesthesia/cardiac and pulmonary complications. Patient provides understanding and gave verbal consent to proceed. Continue pain control and antiemetics as needed Continue Protonix 40 mg IV BID. Nulytely prep, clear liquid diet, NPO at midnight. Consider starting bowel regimen after colonoscopy given patient's significant constipation on CT scan, and chronic opioid  use. Continue daily CBC with transfusion as needed to maintain Hgb >7.  [facility] will follow.     LOS: 0 days   Charlott Rakes  PA-C 03/22/2022, 3:42 PM  Contact #  [phone]

## 2022-03-22 NOTE — ED Notes (Signed)
Pain at LLQ of abdomen, onset at 0200hrs today, has had N/V, states still passing flatus, no appetite. Instructed client to remain NPO until further orders. IV established, Labs obtained, EDP in room.

## 2022-03-22 NOTE — ED Triage Notes (Signed)
Severe abd pain at LLQ , onset this at at approx 0200hrs, has had episodes of vomiting

## 2022-03-22 NOTE — H&P (Signed)
History and Physical    Patient: [name] ION:[id number] DOB: [birth year] DOA: 03/22/2022 DOS: the patient was seen and examined on 03/22/2022 PCP: Aura Dials, PA-C  Patient coming from: Home  Chief Complaint:  Chief Complaint  Patient presents with   Abdominal Pain   HPI: [name] is a 46 y.o. female with medical history significant of fibromyalgia, endometriosis. Presenting with abdominal pain. She was in her normal state of health until 0200hrs this morning. She began to have severe, sharp left-sided abdominal pain. She had N/V, but no hematemesis. She had an episode of diarrhea as well. No fevers. She didn't try any medicines to help. She thought her pain may be that of an SBO as she has had in the past, so she came to the ED for assistance. She denies any other aggravating or alleviating factors.    Review of Systems: As mentioned in the history of present illness. All other systems reviewed and are negative. Past Medical History:  Diagnosis Date   Endometriosis    Fibroids    Ileus, postoperative (HCC)    Partial bowel obstruction (HCC)    Past Surgical History:  Procedure Laterality Date   LAPAROSCOPIC ABDOMINAL EXPLORATION     Social History:  reports that she has never smoked. She has never used smokeless tobacco. She reports that she does not drink alcohol and does not use drugs.  Allergies  Allergen Reactions   Morphine And Related Hives   Reglan [Metoclopramide] Anxiety    Family History  Problem Relation Age of Onset   Hypertension Maternal Grandmother     Prior to Admission medications   Medication Sig Start Date End Date Taking? Authorizing Provider  amitriptyline (ELAVIL) 25 MG tablet TAKE 2TABS BY MOUTH AT BEDTIME (IF TOLERATED AFTER 1-2WEEKS CAN INCREASE TO 3TABS AT BEDTIME) Patient not taking: Reported on 08/06/2015 02/20/14   Lahoma Crocker, MD  DULoxetine (CYMBALTA) 60 MG capsule Take 60 mg by mouth daily.    [provider]  Multiple Vitamin (MULTIVITAMIN WITH MINERALS) TABS tablet Take 1 tablet by mouth daily.    [provider]  Norethindrone Acetate (AYGESTIN PO) Take by mouth.    [provider]  oxyCODONE-acetaminophen (PERCOCET) 10-325 MG per tablet Take 1 tablet by mouth 3 (three) times daily as needed for pain. 07/03/14   Shelly Bombard, MD  oxymetazoline (AFRIN) 0.05 % nasal spray Place 1 spray into both nostrils 2 (two) times daily as needed for congestion.    [provider]  promethazine (PHENERGAN) 12.5 MG tablet TAKE 2 TABLETS BY MOUTH EVERY 8 HOURS AS NEEDED. 04/12/14   Lahoma Crocker, MD  Pseudoephedrine HCl (SUDAFED 24 HOUR PO) Take 1 tablet by mouth daily as needed. For congestion    [provider]  zolpidem (AMBIEN) 10 MG tablet TAKE 1 TABLET BY MOUTH AT BEDTIME AS NEEDED 05/26/14   Lahoma Crocker, MD    Physical Exam: Vitals:   03/22/22 1148 03/22/22 1350 03/22/22 1355 03/22/22 1500  BP: 137/83 136/78  (!) 153/84  Pulse: 96 92 92 88  Resp: '18 17  17  '$ Temp:  98.7 F (37.1 C)    SpO2: 95% 95% 95% 97%  Weight:      Height:       General: 46 y.o. female resting in bed in NAD Eyes: PERRL, normal sclera ENMT: Nares patent w/o discharge, orophaynx clear, dentition normal, ears w/o discharge/lesions/ulcers Neck: Supple, trachea midline Cardiovascular: RRR, +S1, S2, no m/g/r, equal pulses throughout Respiratory:  CTABL, no w/r/r, normal WOB GI: BS+, ND, LUQ/LLQ TTP, no masses noted, no organomegaly noted MSK: No e/c/c Neuro: A&O x 3, no focal deficits Psyc: Appropriate interaction and affect, calm/cooperative  Data Reviewed:  Results for orders placed or performed during the hospital encounter of 03/22/22 (from the past 24 hour(s))  Lipase, blood     Status: None   Collection Time: 03/22/22  8:46 AM  Result Value Ref Range   Lipase 28 11 - 51 U/L  Comprehensive metabolic panel     Status: Abnormal   Collection Time:  03/22/22  8:46 AM  Result Value Ref Range   Sodium 139 135 - 145 mmol/L   Potassium 4.1 3.5 - 5.1 mmol/L   Chloride 109 98 - 111 mmol/L   CO2 23 22 - 32 mmol/L   Glucose, Bld 117 (H) 70 - 99 mg/dL   BUN 12 6 - 20 mg/dL   Creatinine, Ser 0.95 0.44 - 1.00 mg/dL   Calcium 9.1 8.9 - 10.3 mg/dL   Total Protein 7.3 6.5 - 8.1 g/dL   Albumin 3.8 3.5 - 5.0 g/dL   AST 17 15 - 41 U/L   ALT 16 0 - 44 U/L   Alkaline Phosphatase 43 38 - 126 U/L   Total Bilirubin 0.4 0.3 - 1.2 mg/dL   GFR, Estimated >60 >60 mL/min   Anion gap 7 5 - 15  CBC     Status: Abnormal   Collection Time: 03/22/22  8:46 AM  Result Value Ref Range   WBC 10.3 4.0 - 10.5 K/uL   RBC 4.18 3.87 - 5.11 MIL/uL   Hemoglobin 12.9 12.0 - 15.0 g/dL   HCT 38.8 36.0 - 46.0 %   MCV 92.8 80.0 - 100.0 fL   MCH 30.9 26.0 - 34.0 pg   MCHC 33.2 30.0 - 36.0 g/dL   RDW 12.5 11.5 - 15.5 %   Platelets 480 (H) 150 - 400 K/uL   nRBC 0.0 0.0 - 0.2 %  Urinalysis, Routine w reflex microscopic Urine, Clean Catch     Status: Abnormal   Collection Time: 03/22/22  9:30 AM  Result Value Ref Range   Color, Urine YELLOW YELLOW   APPearance CLEAR CLEAR   Specific Gravity, Urine 1.025 1.005 - 1.030   pH 5.5 5.0 - 8.0   Glucose, UA NEGATIVE NEGATIVE mg/dL   Hgb urine dipstick MODERATE (A) NEGATIVE   Bilirubin Urine NEGATIVE NEGATIVE   Ketones, ur NEGATIVE NEGATIVE mg/dL   Protein, ur >=300 (A) NEGATIVE mg/dL   Nitrite NEGATIVE NEGATIVE   Leukocytes,Ua NEGATIVE NEGATIVE  Pregnancy, urine     Status: None   Collection Time: 03/22/22  9:30 AM  Result Value Ref Range   Preg Test, Ur NEGATIVE NEGATIVE  Urinalysis, Microscopic (reflex)     Status: Abnormal   Collection Time: 03/22/22  9:30 AM  Result Value Ref Range   RBC / HPF 11-20 0 - 5 RBC/hpf   WBC, UA 0-5 0 - 5 WBC/hpf   Bacteria, UA FEW (A) NONE SEEN   Squamous Epithelial / LPF 0-5 0 - 5   CT ab/pelvis 1. Short-segment colo-colonic intussusception involving the transverse colon with  suspected underlying mass lesion serving as a lead point. Recommend correlation with colonoscopy. 2. Moderate stool burden throughout the colon extending to the splenic flexure with decompressed colon beyond the splenic flexure.  Assessment and Plan: Intussusception     - admit to inpt, tele     - General surgery has  evaluated and requested that GI take the lead w/ a c-scope and medicine admit for observation     - c-scope in AM w/ [facility], appreciate assistance     - fluids, anti-emetics     - defer further plan to surgery  Endometriosis     - continue outpt follow up  Advance Care Planning:   Code Status: FULL  Consults: [facility], General surgery  Family Communication: w/ sister at bedside  Severity of Illness: The appropriate patient status for this patient is INPATIENT. Inpatient status is judged to be reasonable and necessary in order to provide the required intensity of service to ensure the patient's safety. The patient's presenting symptoms, physical exam findings, and initial radiographic and laboratory data in the context of their chronic comorbidities is felt to place them at high risk for further clinical deterioration. Furthermore, it is not anticipated that the patient will be medically stable for discharge from the hospital within 2 midnights of admission.   * I certify that at the point of admission it is my clinical judgment that the patient will require inpatient hospital care spanning beyond 2 midnights from the point of admission due to high intensity of service, high risk for further deterioration and high frequency of surveillance required.*  Author: Jonnie Finner, DO 03/22/2022 4:01 PM  For on call review [email or web address].

## 2022-03-22 NOTE — ED Notes (Signed)
ED Provider at bedside. 

## 2022-03-22 NOTE — Consult Note (Signed)
Referring Provider: [facility] Primary Care Physician:  Selinda Orion Primary Gastroenterologist:  Althia Forts (Dr. Cindee Salt)  Reason for Consultation: Abnormal CT scan  HPI: [name] is a 46 y.o. female with past medical history as of [name], fibromyalgia, pelvic pain, who presented to the emergency room for evaluation of nausea, vomiting, abdominal pain.  Patient reports sudden onset abdominal pain, nausea, vomiting yesterday.  She started vomiting "every other minute", emesis was no bloody, given severity of abdominal pain, nausea and vomiting she decided to present to the emergency room.  Notes her pain was achy in her lower abdomen.  She has had no further episodes of vomiting since presenting to the ED, her abdominal pain is also improved.  Reports she has had 4 prior episodes similar to this, she has not had any evaluation for her nausea, vomiting, abdominal pain.  Reports her last bowel movement was this morning with small amount of red blood in her stool.  Denies melena.  She has flatulence today.  Last colonoscopy 10/30/2020 with Dr. Eusebio Friendly showing benign polyp in the ascending colon, otherwise normal.  Denies blood thinning medication, denies alcohol use, smoking, NSAID use.   She has history of multiple previous abdominal surgeries for endometriosis.  In the ED, normal lipase, no anemia or leukocytosis, vital signs stable, CT scan with findings of short segment colocolonic intussusception involving the transverse colon with suspected underlying mass lesion as well as moderate stool burden.    Past Medical History:  Diagnosis Date   Endometriosis    Fibroids    Ileus, postoperative (HCC)    Partial bowel obstruction (HCC)     Past Surgical History:  Procedure Laterality Date   LAPAROSCOPIC ABDOMINAL EXPLORATION      Prior to Admission medications   Medication Sig Start Date End Date Taking? Authorizing Provider  amitriptyline (ELAVIL) 25 MG tablet TAKE  2TABS BY MOUTH AT BEDTIME (IF TOLERATED AFTER 1-2WEEKS CAN INCREASE TO 3TABS AT BEDTIME) Patient not taking: Reported on 08/06/2015 02/20/14   Lahoma Crocker, MD  DULoxetine (CYMBALTA) 60 MG capsule Take 60 mg by mouth daily.    [provider]  Multiple Vitamin (MULTIVITAMIN WITH MINERALS) TABS tablet Take 1 tablet by mouth daily.    [provider]  Norethindrone Acetate (AYGESTIN PO) Take by mouth.    [provider]  oxyCODONE-acetaminophen (PERCOCET) 10-325 MG per tablet Take 1 tablet by mouth 3 (three) times daily as needed for pain. 07/03/14   Shelly Bombard, MD  oxymetazoline (AFRIN) 0.05 % nasal spray Place 1 spray into both nostrils 2 (two) times daily as needed for congestion.    [provider]  promethazine (PHENERGAN) 12.5 MG tablet TAKE 2 TABLETS BY MOUTH EVERY 8 HOURS AS NEEDED. 04/12/14   Lahoma Crocker, MD  Pseudoephedrine HCl (SUDAFED 24 HOUR PO) Take 1 tablet by mouth daily as needed. For congestion    [provider]  zolpidem (AMBIEN) 10 MG tablet TAKE 1 TABLET BY MOUTH AT BEDTIME AS NEEDED 05/26/14   Lahoma Crocker, MD    Scheduled Meds:  polyethylene glycol-electrolytes  4,000 mL Oral Once   Continuous Infusions: PRN Meds:.  Allergies as of 03/22/2022 - Review Complete 03/22/2022  Allergen Reaction Noted   Morphine and related Hives 10/25/2012   Reglan [metoclopramide] Anxiety 10/25/2012    Family History  Problem Relation Age of Onset   Hypertension Maternal Grandmother     Social History   Socioeconomic History   Marital status: Married  Spouse name: Not on file   Number of children: Not on file   Years of education: Not on file   Highest education level: Not on file  Occupational History   Not on file  Tobacco Use   Smoking status: Never   Smokeless tobacco: Never  Substance and Sexual Activity   Alcohol use: No   Drug use: No   Sexual activity: Yes    Birth control/protection: Pill   Other Topics Concern   Not on file  Social History Narrative   Not on file   Social Determinants of Health   Financial Resource Strain: Not on file  Food Insecurity: Not on file  Transportation Needs: Not on file  Physical Activity: Not on file  Stress: Not on file  Social Connections: Not on file  Intimate Partner Violence: Not on file    Review of Systems: All negative except as stated above in HPI.  Physical Exam:Physical Exam Constitutional:      Appearance: Normal appearance. She is normal weight. She is ill-appearing.  HENT:     Head: Normocephalic and atraumatic.     Right Ear: External ear normal.     Left Ear: External ear normal.     Nose: Nose normal.     Mouth/Throat:     Mouth: Mucous membranes are moist.  Eyes:     General: No scleral icterus.    Pupils: Pupils are equal, round, and reactive to light.  Cardiovascular:     Rate and Rhythm: Normal rate and regular rhythm.     Pulses: Normal pulses.     Heart sounds: Normal heart sounds.  Pulmonary:     Effort: Pulmonary effort is normal.     Breath sounds: Normal breath sounds.  Abdominal:     General: Abdomen is flat. Bowel sounds are normal. There is no distension.     Palpations: Abdomen is soft. There is no mass.     Tenderness: There is abdominal tenderness (lower abdomen). There is guarding. There is no rebound.     Hernia: No hernia is present.  Musculoskeletal:        General: Normal range of motion.     Cervical back: Normal range of motion and neck supple.  Skin:    General: Skin is warm and dry.  Neurological:     General: No focal deficit present.     Mental Status: She is alert and oriented to person, place, and time. Mental status is at baseline.  Psychiatric:        Mood and Affect: Mood normal.        Behavior: Behavior normal.     Vital signs: Vitals:   03/22/22 1355 03/22/22 1500  BP:  (!) 153/84  Pulse: 92 88  Resp:  17  Temp:    SpO2: 95% 97%        GI:  Lab  Results: Recent Labs    03/22/22 0846  WBC 10.3  HGB 12.9  HCT 38.8  PLT 480*   BMET Recent Labs    03/22/22 0846  NA 139  K 4.1  CL 109  CO2 23  GLUCOSE 117*  BUN 12  CREATININE 0.95  CALCIUM 9.1   LFT Recent Labs    03/22/22 0846  PROT 7.3  ALBUMIN 3.8  AST 17  ALT 16  ALKPHOS 43  BILITOT 0.4   PT/INR No results for input(s): "LABPROT", "INR" in the last 72 hours.   Studies/Results: CT Abdomen Pelvis W Contrast  Result Date: 03/22/2022 CLINICAL DATA:  Nausea, vomiting EXAM: CT ABDOMEN AND PELVIS WITH CONTRAST TECHNIQUE: Multidetector CT imaging of the abdomen and pelvis was performed using the standard protocol following bolus administration of intravenous contrast. RADIATION DOSE REDUCTION: This exam was performed according to the departmental dose-optimization program which includes automated exposure control, adjustment of the mA and/or kV according to patient size and/or use of iterative reconstruction technique. CONTRAST:  137m OMNIPAQUE IOHEXOL 300 MG/ML  SOLN COMPARISON:  CT abdomen/pelvis 07/02/2013 FINDINGS: Lower chest: The lung bases are clear. The imaged heart is unremarkable. Hepatobiliary: The liver and gallbladder are unremarkable. There is no biliary ductal dilatation. Pancreas: Unremarkable. Spleen: Unremarkable. Adrenals/Urinary Tract: Adrenals are unremarkable. The kidneys are unremarkable, with no focal lesion, stone, hydronephrosis, or hydroureter. Stomach/Bowel: The stomach is unremarkable. There is a moderate stool burden throughout the colon. There is short-segment colo-colonic intussusception involving the transverse colon with a suspected underlying mass lesion serving as a lead point (2-56, 5-52). The colon beyond the splenic flexure is decompressed. Vascular/Lymphatic: The abdominal aorta is normal in course and caliber. The major branch vessels are patent. The main portal and splenic veins are patent. There is no abdominopelvic lymphadenopathy.  Reproductive: The uterus and adnexa are unremarkable. Other: There is no ascites or free air. Musculoskeletal: There is no acute osseous abnormality or suspicious osseous lesion. IMPRESSION: 1. Short-segment colo-colonic intussusception involving the transverse colon with suspected underlying mass lesion serving as a lead point. Recommend correlation with colonoscopy. 2. Moderate stool burden throughout the colon extending to the splenic flexure with decompressed colon beyond the splenic flexure. Electronically Signed   By: PValetta MoleM.D.   On: 03/22/2022 10:56   DG Chest Port 1 View  Result Date: 03/22/2022 CLINICAL DATA:  Left lower quadrant abdominal pain. EXAM: PORTABLE CHEST 1 VIEW COMPARISON:  06/09/2007 FINDINGS: Grossly unchanged cardiac silhouette and mediastinal contours given AP projection and portable technique. No focal airspace opacities. No pleural effusion or pneumothorax. No evidence of edema. No acute osseous abnormalities. IMPRESSION: No acute cardiopulmonary disease. Electronically Signed   By: JSandi MariscalM.D.   On: 03/22/2022 09:23    Impression: Abdominal pain, nausea, vomiting Abnormal CT scan Constipation  Patient with sudden onset nausea, vomiting, abdominal pain, abdominal pain and vomiting improved at this time with IV fluids, Zofran, Dilaudid.  Patient reports this happened several times prior.  CT scan with findings of short segment colo-colonic intussusception with suspected underlying mass.  Last colonoscopy 10/2020, patient receives colonoscopies every 5 years since she was 89  Possible abdominal pain, nausea, vomiting secondary to intussusception.  Abdominal pain, nausea, vomiting may be worsened by significant constipation.  Patient is on Percocet 3 times daily. concern for underlying mass patient would benefit from further evaluation with colonoscopy.  CT abdomen pelvis with contrast 03/22/2022 1. Short-segment colo-colonic intussusception involving the transverse  colon with suspected underlying mass lesion serving as a lead point. Recommend correlation with colonoscopy. 2. Moderate stool burden throughout the colon extending to the splenic flexure with decompressed colon beyond the splenic flexure.   Plan: Plan for Colonoscopy tomorrow. I thoroughly discussed the procedures to include nature, alternatives, benefits, and risks including but not limited to bleeding, perforation, infection, anesthesia/cardiac and pulmonary complications. Patient provides understanding and gave verbal consent to proceed. Continue pain control and antiemetics as needed Continue Protonix 40 mg IV BID. Nulytely prep, clear liquid diet, NPO at midnight. Consider starting bowel regimen after colonoscopy given patient's significant constipation on CT scan, and chronic opioid  use. Continue daily CBC with transfusion as needed to maintain Hgb >7.  [facility] will follow.     LOS: 0 days   Charlott Rakes  PA-C 03/22/2022, 3:42 PM  Contact #  [phone]

## 2022-03-23 ENCOUNTER — Inpatient Hospital Stay (HOSPITAL_COMMUNITY): Payer: BC Managed Care – PPO | Admitting: Anesthesiology

## 2022-03-23 ENCOUNTER — Encounter (HOSPITAL_COMMUNITY)
Admission: EM | Disposition: A | Discharge status: Home / Self Care | Payer: Self-pay | Source: Home / Self Care | Attending: Internal Medicine

## 2022-03-23 ENCOUNTER — Encounter (HOSPITAL_COMMUNITY): Payer: Self-pay | Admitting: Internal Medicine

## 2022-03-23 DIAGNOSIS — N809 Endometriosis, unspecified: Secondary | ICD-10-CM

## 2022-03-23 DIAGNOSIS — I10 Essential (primary) hypertension: Secondary | ICD-10-CM

## 2022-03-23 HISTORY — PX: COLONOSCOPY WITH PROPOFOL: SHX5780

## 2022-03-23 HISTORY — PX: SUBMUCOSAL TATTOO INJECTION: SHX6856

## 2022-03-23 HISTORY — PX: BIOPSY: SHX5522

## 2022-03-23 LAB — COMPREHENSIVE METABOLIC PANEL
ALT: 11 U/L (ref 0–44)
AST: 16 U/L (ref 15–41)
Albumin: 2.5 g/dL — ABNORMAL LOW (ref 3.5–5.0)
Alkaline Phosphatase: 31 U/L — ABNORMAL LOW (ref 38–126)
Anion gap: 3 — ABNORMAL LOW (ref 5–15)
BUN: 7 mg/dL (ref 6–20)
CO2: 22 mmol/L (ref 22–32)
Calcium: 7.3 mg/dL — ABNORMAL LOW (ref 8.9–10.3)
Chloride: 116 mmol/L — ABNORMAL HIGH (ref 98–111)
Creatinine, Ser: 0.6 mg/dL (ref 0.44–1.00)
GFR, Estimated: >60 mL/min (ref >60)
Glucose, Bld: 94 mg/dL (ref 70–99)
Potassium: 3.4 mmol/L — ABNORMAL LOW (ref 3.5–5.1)
Sodium: 141 mmol/L (ref 135–145)
Total Bilirubin: 0.8 mg/dL (ref 0.3–1.2)
Total Protein: 4.9 g/dL — ABNORMAL LOW (ref 6.5–8.1)

## 2022-03-23 LAB — CBC
HCT: 34.3 % — ABNORMAL LOW (ref 36.0–46.0)
Hemoglobin: 11.1 g/dL — ABNORMAL LOW (ref 12.0–15.0)
MCH: 30.7 pg (ref 26.0–34.0)
MCHC: 32.4 g/dL (ref 30.0–36.0)
MCV: 95 fL (ref 80.0–100.0)
Platelets: 424 10*3/uL — ABNORMAL HIGH (ref 150–400)
RBC: 3.61 MIL/uL — ABNORMAL LOW (ref 3.87–5.11)
RDW: 12.5 % (ref 11.5–15.5)
WBC: 8.2 10*3/uL (ref 4.0–10.5)
nRBC: 0 % (ref 0.0–0.2)

## 2022-03-23 SURGERY — COLONOSCOPY WITH PROPOFOL
Anesthesia: General

## 2022-03-23 MED ORDER — SODIUM CHLORIDE 0.9 % IV SOLN: INTRAVENOUS | Status: AC

## 2022-03-23 MED ORDER — PROPOFOL 1000 MG/100ML IV EMUL
INTRAVENOUS | Status: AC
  Filled 2022-03-23: qty 100

## 2022-03-23 MED ORDER — OXYCODONE HCL 5 MG/5ML PO SOLN: 5.0000 mg | Freq: Once | ORAL | Status: AC | PRN

## 2022-03-23 MED ORDER — PROPOFOL 10 MG/ML IV BOLUS
INTRAVENOUS | Status: DC | PRN
  Administered 2022-03-23: 20 mg via INTRAVENOUS
  Administered 2022-03-23 (×2): 30 mg via INTRAVENOUS
  Administered 2022-03-23: 40 mg via INTRAVENOUS
  Administered 2022-03-23: 30 mg via INTRAVENOUS

## 2022-03-23 MED ORDER — SPOT INK MARKER SYRINGE KIT
PACK | SUBMUCOSAL | Status: AC
  Filled 2022-03-23: qty 5

## 2022-03-23 MED ORDER — NORETHINDRONE 0.35 MG PO TABS: 1.0000 | ORAL_TABLET | Freq: Every day | ORAL | Status: DC

## 2022-03-23 MED ORDER — POTASSIUM CHLORIDE CRYS ER 20 MEQ PO TBCR
40.0000 meq | EXTENDED_RELEASE_TABLET | Freq: Once | ORAL | Status: AC
  Administered 2022-03-23: 40 meq via ORAL
  Filled 2022-03-23: qty 2

## 2022-03-23 MED ORDER — DEXMEDETOMIDINE HCL IN NACL 80 MCG/20ML IV SOLN
INTRAVENOUS | Status: AC
  Filled 2022-03-23: qty 20

## 2022-03-23 MED ORDER — LACTATED RINGERS IV SOLN: INTRAVENOUS | Status: DC

## 2022-03-23 MED ORDER — FENTANYL CITRATE (PF) 100 MCG/2ML IJ SOLN: 25.0000 ug | INTRAMUSCULAR | Status: DC | PRN

## 2022-03-23 MED ORDER — ACETAMINOPHEN 325 MG PO TABS
650.0000 mg | ORAL_TABLET | Freq: Four times a day (QID) | ORAL | Status: DC | PRN
  Administered 2022-03-23 – 2022-03-24 (×3): 650 mg via ORAL
  Filled 2022-03-23 (×3): qty 2

## 2022-03-23 MED ORDER — TRAZODONE HCL 50 MG PO TABS
100.0000 mg | ORAL_TABLET | Freq: Every day | ORAL | Status: DC
  Administered 2022-03-23 – 2022-03-24 (×2): 100 mg via ORAL
  Filled 2022-03-23 (×2): qty 2

## 2022-03-23 MED ORDER — TIZANIDINE HCL 4 MG PO TABS
4.0000 mg | ORAL_TABLET | Freq: Three times a day (TID) | ORAL | Status: DC | PRN
  Administered 2022-03-23 – 2022-03-24 (×3): 4 mg via ORAL
  Filled 2022-03-23 (×4): qty 1

## 2022-03-23 MED ORDER — PROPOFOL 500 MG/50ML IV EMUL
INTRAVENOUS | Status: AC
  Filled 2022-03-23: qty 50

## 2022-03-23 MED ORDER — FENTANYL CITRATE PF 50 MCG/ML IJ SOSY: 25.0000 ug | PREFILLED_SYRINGE | INTRAMUSCULAR | Status: DC | PRN

## 2022-03-23 MED ORDER — SPOT INK MARKER SYRINGE KIT
PACK | SUBMUCOSAL | Status: DC | PRN
  Administered 2022-03-23: 4 mL via SUBMUCOSAL

## 2022-03-23 MED ORDER — METHOCARBAMOL 500 MG PO TABS: 1000.0000 mg | ORAL_TABLET | Freq: Two times a day (BID) | ORAL | Status: DC | PRN

## 2022-03-23 MED ORDER — PROPOFOL 500 MG/50ML IV EMUL
INTRAVENOUS | Status: DC | PRN
  Administered 2022-03-23: 135 ug/kg/min via INTRAVENOUS

## 2022-03-23 MED ORDER — ONDANSETRON HCL 4 MG/2ML IJ SOLN
4.0000 mg | Freq: Four times a day (QID) | INTRAMUSCULAR | Status: DC | PRN
  Administered 2022-03-23: 4 mg via INTRAVENOUS
  Filled 2022-03-23: qty 2

## 2022-03-23 MED ORDER — ACETAMINOPHEN 325 MG PO TABS
650.0000 mg | ORAL_TABLET | Freq: Once | ORAL | Status: AC
  Administered 2022-03-23: 650 mg via ORAL
  Filled 2022-03-23: qty 2

## 2022-03-23 MED ORDER — SODIUM CHLORIDE 0.9 % IV SOLN: INTRAVENOUS | Status: DC

## 2022-03-23 MED ORDER — OXYCODONE HCL 5 MG PO TABS
5.0000 mg | ORAL_TABLET | Freq: Once | ORAL | Status: AC | PRN
  Administered 2022-03-23: 5 mg via ORAL
  Filled 2022-03-23: qty 1

## 2022-03-23 MED ORDER — LISINOPRIL 10 MG PO TABS
10.0000 mg | ORAL_TABLET | Freq: Every day | ORAL | Status: DC
  Administered 2022-03-23 – 2022-03-25 (×3): 10 mg via ORAL
  Filled 2022-03-23 (×3): qty 1

## 2022-03-23 SURGICAL SUPPLY — 22 items

## 2022-03-23 NOTE — Progress Notes (Signed)
[facility] PROGRESS NOTE   [name] MGQ:[id number] DOB: January 15, 1976 DOA: 03/22/2022  PCP: Aura Dials, PA-C  Brief History/Interval Summary: 46 y.o. female with medical history significant of fibromyalgia, endometriosis.  Patient presented with abdominal pain.  CT scan raised concern for colonic intussusception.  General surgery and gastroenterology were consulted.    Consultants: Gastroenterology.  General surgery  Procedures: None yet    Subjective/Interval History: Patient mentions that she is feeling better this morning.  She started passing gas last night.  Has not had a bowel movement yet.  Tolerating her prep.     Assessment/Plan:  Intussusception This was thought to be colocolonic intussusception based on CT scan.  General surgery and gastroenterology consulted.  Patient is feeling better this morning after she has passed flatus.  Plan is for colonoscopy today to rule out any colonic lesions.  Patient mentioned that she had a colonoscopy within the last 1 to 2 years which did show a polyp but no other lesions were noted.  This is reassuring. She does have a significant history of endometriosis which could be one of the reasons for her presentation.  Endometriosis Followed by gynecology.   Hypokalemia Will be repleted.  Check magnesium tomorrow.  Normocytic anemia Mild drop in hemoglobin is likely dilutional.  Recheck labs tomorrow.  Essential hypertension She mentions that she takes lisinopril for same.  This can be resumed after her procedure this evening.  Chronic pain syndrome Secondary to endometriosis.  Followed by pain medication specialist.  She is on oxycodone and Robaxin.   DVT Prophylaxis: SCDs Code Status: Full code Family Communication: Discussed with the patient Disposition Plan: Hopefully return home when improved  Status is: Inpatient Remains inpatient appropriate because: Intussusception      Medications:  Scheduled:  influenza vac split quadrivalent PF  0.5 mL Intramuscular Tomorrow-1000   Continuous:  sodium chloride 100 mL/hr at 03/22/22 1827   OIZ:[id number] (DILAUDID) injection, prochlorperazine  Antibiotics: Anti-infectives (From admission, onward)    None       Objective:  Vital Signs  Vitals:   03/23/22 0355 03/23/22 0500 03/23/22 0515 03/23/22 0755  BP:  (!) 160/99    Pulse:  81 82   Resp:  15 15   Temp: 98.4 F (36.9 C)   98.1 F (36.7 C)  TempSrc: Oral   Oral  SpO2:  95% 96%   Weight:      Height:       No intake or output data in the 24 hours ending 03/23/22 0944 Filed Weights   03/22/22 0842  Weight: 69.4 kg    General appearance: Awake alert.  In no distress Resp: Clear to auscultation bilaterally.  Normal effort Cardio: S1-S2 is normal regular.  No S3-S4.  No rubs murmurs or bruit GI: Abdomen is soft.  Mildly distended.  Bowel sounds heard.  Mildly tender without any rebound rigidity or guarding.  No masses organomegaly. Extremities: No edema.  Full range of motion of lower extremities. Neurologic: Alert and oriented x3.  No focal neurological deficits.    Lab Results:  Data Reviewed: I have personally reviewed following labs and reports of the imaging studies  CBC: Recent Labs  Lab 03/22/22 0846 03/23/22 0500  WBC 10.3 8.2  HGB 12.9 11.1*  HCT 38.8 34.3*  MCV 92.8 95.0  PLT 480* 424*    Basic Metabolic Panel: Recent Labs  Lab 03/22/22 0846 03/23/22 0500  NA 139 141  K 4.1 3.4*  CL 109 116*  CO2  23 22  GLUCOSE 117* 94  BUN 12 7  CREATININE 0.95 0.60  CALCIUM 9.1 7.3*    GFR: Estimated Creatinine Clearance: 81.4 mL/min (by C-G formula based on SCr of 0.6 mg/dL).  Liver Function Tests: Recent Labs  Lab 03/22/22 0846 03/23/22 0500  AST 17 16  ALT 16 11  ALKPHOS 43 31*  BILITOT 0.4 0.8  PROT 7.3 4.9*  ALBUMIN 3.8 2.5*    Recent Labs  Lab 03/22/22 0846  LIPASE 28    Radiology Studies: CT Abdomen Pelvis W  Contrast  Result Date: 03/22/2022 CLINICAL DATA:  Nausea, vomiting EXAM: CT ABDOMEN AND PELVIS WITH CONTRAST TECHNIQUE: Multidetector CT imaging of the abdomen and pelvis was performed using the standard protocol following bolus administration of intravenous contrast. RADIATION DOSE REDUCTION: This exam was performed according to the departmental dose-optimization program which includes automated exposure control, adjustment of the mA and/or kV according to patient size and/or use of iterative reconstruction technique. CONTRAST:  160m OMNIPAQUE IOHEXOL 300 MG/ML  SOLN COMPARISON:  CT abdomen/pelvis 07/02/2013 FINDINGS: Lower chest: The lung bases are clear. The imaged heart is unremarkable. Hepatobiliary: The liver and gallbladder are unremarkable. There is no biliary ductal dilatation. Pancreas: Unremarkable. Spleen: Unremarkable. Adrenals/Urinary Tract: Adrenals are unremarkable. The kidneys are unremarkable, with no focal lesion, stone, hydronephrosis, or hydroureter. Stomach/Bowel: The stomach is unremarkable. There is a moderate stool burden throughout the colon. There is short-segment colo-colonic intussusception involving the transverse colon with a suspected underlying mass lesion serving as a lead point (2-56, 5-52). The colon beyond the splenic flexure is decompressed. Vascular/Lymphatic: The abdominal aorta is normal in course and caliber. The major branch vessels are patent. The main portal and splenic veins are patent. There is no abdominopelvic lymphadenopathy. Reproductive: The uterus and adnexa are unremarkable. Other: There is no ascites or free air. Musculoskeletal: There is no acute osseous abnormality or suspicious osseous lesion. IMPRESSION: 1. Short-segment colo-colonic intussusception involving the transverse colon with suspected underlying mass lesion serving as a lead point. Recommend correlation with colonoscopy. 2. Moderate stool burden throughout the colon extending to the splenic  flexure with decompressed colon beyond the splenic flexure. Electronically Signed   By: PValetta MoleM.D.   On: 03/22/2022 10:56   DG Chest Port 1 View  Result Date: 03/22/2022 CLINICAL DATA:  Left lower quadrant abdominal pain. EXAM: PORTABLE CHEST 1 VIEW COMPARISON:  06/09/2007 FINDINGS: Grossly unchanged cardiac silhouette and mediastinal contours given AP projection and portable technique. No focal airspace opacities. No pleural effusion or pneumothorax. No evidence of edema. No acute osseous abnormalities. IMPRESSION: No acute cardiopulmonary disease. Electronically Signed   By: JSandi MariscalM.D.   On: 03/22/2022 09:23       LOS: 1 day   [facility] Pager on [email or web address]  03/23/2022, 9:44 AM

## 2022-03-23 NOTE — Progress Notes (Signed)
[facility] Progress Note  Day of Surgery  Subjective: CC-  Feeling much better today. States that her pain started to improve in the middle of the night when she was drinking the prep for her colonoscopy. She was able to release a lot of gas and had several loose Bms. Denies n/v. Currently tolerating clear liquids. Baseline chronic pelvic pain is 4/10, currently pain is only about 5/10.  Objective: Vital signs in last 24 hours: Temp:  [97.4 F (36.3 C)-98.7 F (37.1 C)] 97.4 F (36.3 C) (10/03 1136) Pulse Rate:  [81-97] 89 (10/03 1156) Resp:  [12-24] 17 (10/03 1156) BP: (108-164)/(56-99) 144/86 (10/03 1156) SpO2:  [91 %-100 %] 99 % (10/03 1156)    Intake/Output from previous day: No intake/output data recorded. Intake/Output this shift: Total I/O In: 500 [I.V.:500] Out: -   PE: Gen:  Alert, NAD, pleasant Pulm: rate and effort normal Abd: soft, ND, minimal left sided tenderness without rebound or guarding  Lab Results:  Recent Labs    03/22/22 0846 03/23/22 0500  WBC 10.3 8.2  HGB 12.9 11.1*  HCT 38.8 34.3*  PLT 480* 424*   BMET Recent Labs    03/22/22 0846 03/23/22 0500  NA 139 141  K 4.1 3.4*  CL 109 116*  CO2 23 22  GLUCOSE 117* 94  BUN 12 7  CREATININE 0.95 0.60  CALCIUM 9.1 7.3*   PT/INR No results for input(s): "LABPROT", "INR" in the last 72 hours. CMP     Component Value Date/Time   NA 141 03/23/2022 0500   K 3.4 (L) 03/23/2022 0500   CL 116 (H) 03/23/2022 0500   CO2 22 03/23/2022 0500   GLUCOSE 94 03/23/2022 0500   BUN 7 03/23/2022 0500   CREATININE 0.60 03/23/2022 0500   CALCIUM 7.3 (L) 03/23/2022 0500   PROT 4.9 (L) 03/23/2022 0500   ALBUMIN 2.5 (L) 03/23/2022 0500   AST 16 03/23/2022 0500   ALT 11 03/23/2022 0500   ALKPHOS 31 (L) 03/23/2022 0500   BILITOT 0.8 03/23/2022 0500   GFRNONAA >60 03/23/2022 0500   GFRAA >90 07/02/2013 1136   Lipase     Component Value Date/Time   LIPASE 28 03/22/2022 0846        Studies/Results: CT Abdomen Pelvis W Contrast  Result Date: 03/22/2022 CLINICAL DATA:  Nausea, vomiting EXAM: CT ABDOMEN AND PELVIS WITH CONTRAST TECHNIQUE: Multidetector CT imaging of the abdomen and pelvis was performed using the standard protocol following bolus administration of intravenous contrast. RADIATION DOSE REDUCTION: This exam was performed according to the departmental dose-optimization program which includes automated exposure control, adjustment of the mA and/or kV according to patient size and/or use of iterative reconstruction technique. CONTRAST:  122m OMNIPAQUE IOHEXOL 300 MG/ML  SOLN COMPARISON:  CT abdomen/pelvis 07/02/2013 FINDINGS: Lower chest: The lung bases are clear. The imaged heart is unremarkable. Hepatobiliary: The liver and gallbladder are unremarkable. There is no biliary ductal dilatation. Pancreas: Unremarkable. Spleen: Unremarkable. Adrenals/Urinary Tract: Adrenals are unremarkable. The kidneys are unremarkable, with no focal lesion, stone, hydronephrosis, or hydroureter. Stomach/Bowel: The stomach is unremarkable. There is a moderate stool burden throughout the colon. There is short-segment colo-colonic intussusception involving the transverse colon with a suspected underlying mass lesion serving as a lead point (2-56, 5-52). The colon beyond the splenic flexure is decompressed. Vascular/Lymphatic: The abdominal aorta is normal in course and caliber. The major branch vessels are patent. The main portal and splenic veins are patent. There is no abdominopelvic lymphadenopathy. Reproductive: The uterus  and adnexa are unremarkable. Other: There is no ascites or free air. Musculoskeletal: There is no acute osseous abnormality or suspicious osseous lesion. IMPRESSION: 1. Short-segment colo-colonic intussusception involving the transverse colon with suspected underlying mass lesion serving as a lead point. Recommend correlation with colonoscopy. 2. Moderate stool burden  throughout the colon extending to the splenic flexure with decompressed colon beyond the splenic flexure. Electronically Signed   By: Valetta Mole M.D.   On: 03/22/2022 10:56   DG Chest Port 1 View  Result Date: 03/22/2022 CLINICAL DATA:  Left lower quadrant abdominal pain. EXAM: PORTABLE CHEST 1 VIEW COMPARISON:  06/09/2007 FINDINGS: Grossly unchanged cardiac silhouette and mediastinal contours given AP projection and portable technique. No focal airspace opacities. No pleural effusion or pneumothorax. No evidence of edema. No acute osseous abnormalities. IMPRESSION: No acute cardiopulmonary disease. Electronically Signed   By: Sandi Mariscal M.D.   On: 03/22/2022 09:23    Anti-infectives: Anti-infectives (From admission, onward)    None        Assessment/Plan Left sided abdominal pain, nausea, vomiting ?Colonic intussusception involving the transverse colon Constipation - CT abdomen/pelvis reports short-segment colo-colonic intussusception involving the transverse colon with suspected underlying mass lesion serving as a lead point; moderate stool burden throughout the colon extending to the splenic flexure with decompressed colon beyond the splenic flexure - s/p colonoscopy earlier today: a localized area of moderately congested, erythematous, inflamed, ulcerated and thickened folds of the mucosa in the transverse colon, 55 cm from insertion (likely site of intussusception noted on CT). Biopsies were taken with a cold forceps for histology. Area was tattooed with an injection of 4 mL of Spot (carbon black). Sounds like to active intussusception and no mass noted. - Patient is clinically much better today. No indication for surgery. Continue clear liquids and bowel regimen. Follow up biopsy results.  ID - none VTE - ok for chemical dvt ppx from surgical standpoint FEN - IVF, CLD Foley - none   Endometriosis s/p multiple prior surgeries Fibromyalgia Chronic pelvic pain on daily oxycodone  '5mg'$  TID and muscle relaxers   I reviewed consult GI notes, last 24 h vitals and pain scores, last 48 h intake and output, last 24 h labs and trends, and last 24 h imaging results.    LOS: 1 day    Wellington Hampshire, [facility] 03/23/2022, 1:41 PM Please see Amion for pager number during day hours 7:00am-4:30pm

## 2022-03-23 NOTE — Transfer of Care (Signed)
Immediate Anesthesia Transfer of Care Note  Patient: [name]  Procedure(s) Performed: COLONOSCOPY WITH PROPOFOL BIOPSY SUBMUCOSAL TATTOO INJECTION  Patient Location: PACU  Anesthesia Type:MAC  Level of Consciousness: drowsy and patient cooperative  Airway & Oxygen Therapy: Patient Spontanous Breathing  Post-op Assessment: Report given to RN and Post -op Vital signs reviewed and stable  Post vital signs: Reviewed and stable  Last Vitals:  Vitals Value Taken Time  BP    Temp    Pulse    Resp    SpO2      Last Pain:  Vitals:   03/23/22 1032  TempSrc: Temporal  PainSc: 5          Complications: No notable events documented.

## 2022-03-23 NOTE — Interval H&P Note (Signed)
History and Physical Interval Note: 46/female with colo-colonic intussusception on CT for colonoscopy with propofol.  03/23/2022 10:54 AM  [name]  has presented today for colonoscopy, with the diagnosis of abnormal CT scan, colocolonic intussuception.  The various methods of treatment have been discussed with the patient and family. After consideration of risks, benefits and other options for treatment, the patient has consented to  Procedure(s): COLONOSCOPY WITH PROPOFOL (N/A) as a surgical intervention.  The patient's history has been reviewed, patient examined, no change in status, stable for surgery.  I have reviewed the patient's chart and labs.  Questions were answered to the patient's satisfaction.     [name]

## 2022-03-23 NOTE — ED Provider Notes (Signed)
Evaluated the patient on arrival to the emergency department.  Overall doing well.  Vital signs are reassuring.  Reports that pain is under control and nausea is under control.  Hospitalist will admit the patient and bed request has been placed.   Fransico Meadow, MD 03/23/22 1323

## 2022-03-23 NOTE — Anesthesia Preprocedure Evaluation (Addendum)
Anesthesia Evaluation  Patient identified by MRN, date of birth, ID band Patient awake    Reviewed: Allergy & Precautions, NPO status , Patient's Chart, lab work & pertinent test results  History of Anesthesia Complications Negative for: history of anesthetic complications  Airway Mallampati: II  TM Distance: >3 FB Neck ROM: Full    Dental  (+) Dental Advisory Given, Chipped   Pulmonary neg pulmonary ROS,    Pulmonary exam normal        Cardiovascular hypertension, Pt. on medications Normal cardiovascular exam     Neuro/Psych negative neurological ROS  negative psych ROS   GI/Hepatic Neg liver ROS,  Colocolonic intussusception    Endo/Other  negative endocrine ROS  Renal/GU negative Renal ROS     Musculoskeletal negative musculoskeletal ROS (+)   Abdominal   Peds  Hematology  (+) Blood dyscrasia, anemia ,   Anesthesia Other Findings CT Abd - IMPRESSION: 1. Short-segment colo-colonic intussusception involving the mtransverse colon with suspected underlying mass lesion serving as a lead point. Recommend correlation with colonoscopy. 2. Moderate stool burden throughout the colon extending to the splenic flexure with decompressed colon beyond the splenic flexure.   Reproductive/Obstetrics  Endometriosis Fibroids                             Anesthesia Physical Anesthesia Plan  ASA: 2  Anesthesia Plan: MAC   Post-op Pain Management: Minimal or no pain anticipated   Induction:   PONV Risk Score and Plan: 2 and Treatment may vary due to age or medical condition and Propofol infusion  Airway Management Planned: Natural Airway and Nasal Cannula  Additional Equipment: None  Intra-op Plan:   Post-operative Plan:   Informed Consent: I have reviewed the patients History and Physical, chart, labs and discussed the procedure including the risks, benefits and alternatives for the  proposed anesthesia with the patient or authorized representative who has indicated his/her understanding and acceptance.       Plan Discussed with: CRNA and Anesthesiologist  Anesthesia Plan Comments:        Anesthesia Quick Evaluation

## 2022-03-23 NOTE — Op Note (Signed)
[facility] Patient Name: [name] Procedure Date: 03/23/2022 MRN: [id number] Attending MD: [name] , MD Date of Birth: [birth year] CSN: [id number] Age: 46 Admit Type: Inpatient Procedure:                Colonoscopy Indications:              Abnormal CT of the GI tract, colo-colonic                            intussusception, aboominal pain Providers:                [name], MD, Velva Harman, RN, Hinton Dyer                            Technician, Technician Referring MD:             [facility] Medicines:                Monitored Anesthesia Care Complications:            No immediate complications. Estimated blood loss:                            Minimal. Estimated Blood Loss:     Estimated blood loss was minimal. Procedure:                Pre-Anesthesia Assessment:                           - Prior to the procedure, a History and Physical                            was performed, and patient medications and                            allergies were reviewed. The patient's tolerance of                            previous anesthesia was also reviewed. The risks                            and benefits of the procedure and the sedation                            options and risks were discussed with the patient.                            All questions were answered, and informed consent                            was obtained. Prior Anticoagulants: The patient has                            taken no previous anticoagulant or antiplatelet                            agents. ASA Grade Assessment:  II - A patient with                            mild systemic disease. After reviewing the risks                            and benefits, the patient was deemed in                            satisfactory condition to undergo the procedure.                           After obtaining informed consent, the colonoscope                            was passed under  direct vision. Throughout the                            procedure, the patient's blood pressure, pulse, and                            oxygen saturations were monitored continuously. The                            [id number]-HQ190L ([id number] Olympus colonoscope was                            introduced through the anus and advanced to the the                            terminal ileum. The colonoscopy was performed                            without difficulty. The patient tolerated the                            procedure well. The quality of the bowel                            preparation was fair and poor. Scope In: 11:08:12 AM Scope Out: 11:29:27 AM Scope Withdrawal Time: 0 hours 10 minutes 10 seconds  Total Procedure Duration: 0 hours 21 minutes 15 seconds  Findings:      Hemorrhoids were found on perianal exam.      The terminal ileum appeared normal.      A localized area of moderately congested, erythematous, inflamed,       ulcerated and thickened folds of the mucosa was found in the transverse       colon, 55 cm from insertion(likely site of intussusception noted on CT).       Biopsies were taken with a cold forceps for histology. Area was tattooed       with an injection of 4 mL of Spot (carbon black).      Non-bleeding internal hemorrhoids were found during retroflexion.      A diffuse area of mildly friable (with contact bleeding) mucosa was  found in the rectum.      The exam was otherwise without abnormality. Impression:               - Preparation of the colon was fair.                           - Preparation of the colon was poor.                           - Hemorrhoids found on perianal exam.                           - The examined portion of the ileum was normal.                           - Congested, erythematous, inflamed, ulcerated and                            thickened folds of the mucosa in the transverse                            colon. Biopsied. Tattooed.                            - Non-bleeding internal hemorrhoids.                           - Friable (with contact bleeding) mucosa in the                            rectum.                           - The examination was otherwise normal. Moderate Sedation:      Patient did not receive moderate sedation for this procedure, but       instead received monitored anesthesia care. Recommendation:           - Clear liquid diet.                           - Continue present medications.                           - Await pathology results. Procedure Code(s):        --- Professional ---                           [phone], Colonoscopy, flexible; with directed                            submucosal injection(s), any substance                           84132, Colonoscopy, flexible; with biopsy, single                            or multiple Diagnosis Code(s):        ---  Professional ---                           K64.8, Other hemorrhoids                           K52.9, Noninfective gastroenteritis and colitis,                            unspecified                           K63.3, Ulcer of intestine                           K63.89, Other specified diseases of intestine                           K62.5, Hemorrhage of anus and rectum                           R93.3, Abnormal findings on diagnostic imaging of                            other parts of digestive tract CPT copyright 2019 [employer]. All rights reserved. The codes documented in this report are preliminary and upon coder review may  be revised to meet current compliance requirements. [name], MD 03/23/2022 11:39:13 AM This report has been signed electronically. Number of Addenda: 0

## 2022-03-23 NOTE — Anesthesia Postprocedure Evaluation (Signed)
Anesthesia Post Note  Patient: [name]  Procedure(s) Performed: COLONOSCOPY WITH PROPOFOL BIOPSY SUBMUCOSAL TATTOO INJECTION     Patient location during evaluation: PACU Anesthesia Type: MAC Level of consciousness: awake and alert Pain management: pain level controlled Vital Signs Assessment: post-procedure vital signs reviewed and stable Respiratory status: spontaneous breathing, nonlabored ventilation and respiratory function stable Cardiovascular status: stable and blood pressure returned to baseline Anesthetic complications: no   No notable events documented.  Last Vitals:  Vitals:   03/23/22 1146 03/23/22 1156  BP: 111/65 (!) 144/86  Pulse: 89 89  Resp: 15 17  Temp:    SpO2: 95% 99%    Last Pain:  Vitals:   03/23/22 1156  TempSrc:   PainSc: 0-No pain                 Audry Pili

## 2022-03-23 NOTE — Anesthesia Procedure Notes (Signed)
Procedure Name: MAC Date/Time: 03/23/2022 10:59 AM  Performed by: Niel Hummer, CRNAPre-anesthesia Checklist: Patient identified, Emergency Drugs available, Suction available and Patient being monitored Oxygen Delivery Method: Simple face mask

## 2022-03-24 DIAGNOSIS — R1032 Left lower quadrant pain: Secondary | ICD-10-CM

## 2022-03-24 LAB — BASIC METABOLIC PANEL
Anion gap: 6 (ref 5–15)
BUN: 5 mg/dL — ABNORMAL LOW (ref 6–20)
CO2: 26 mmol/L (ref 22–32)
Calcium: 8.6 mg/dL — ABNORMAL LOW (ref 8.9–10.3)
Chloride: 108 mmol/L (ref 98–111)
Creatinine, Ser: 0.82 mg/dL (ref 0.44–1.00)
GFR, Estimated: >60 mL/min (ref >60)
Glucose, Bld: 123 mg/dL — ABNORMAL HIGH (ref 70–99)
Potassium: 3.8 mmol/L (ref 3.5–5.1)
Sodium: 140 mmol/L (ref 135–145)

## 2022-03-24 LAB — CBC
HCT: 31.9 % — ABNORMAL LOW (ref 36.0–46.0)
Hemoglobin: 10.5 g/dL — ABNORMAL LOW (ref 12.0–15.0)
MCH: 31.4 pg (ref 26.0–34.0)
MCHC: 32.9 g/dL (ref 30.0–36.0)
MCV: 95.5 fL (ref 80.0–100.0)
Platelets: 376 10*3/uL (ref 150–400)
RBC: 3.34 MIL/uL — ABNORMAL LOW (ref 3.87–5.11)
RDW: 12.5 % (ref 11.5–15.5)
WBC: 8.1 10*3/uL (ref 4.0–10.5)
nRBC: 0 % (ref 0.0–0.2)

## 2022-03-24 LAB — SURGICAL PATHOLOGY

## 2022-03-24 LAB — MAGNESIUM: Magnesium: 1.8 mg/dL (ref 1.7–2.4)

## 2022-03-24 MED ORDER — DOCUSATE SODIUM 100 MG PO CAPS
100.0000 mg | ORAL_CAPSULE | Freq: Two times a day (BID) | ORAL | Status: DC
  Administered 2022-03-24 – 2022-03-25 (×3): 100 mg via ORAL
  Filled 2022-03-24 (×3): qty 1

## 2022-03-24 MED ORDER — HYDROMORPHONE HCL 1 MG/ML IJ SOLN: 1.0000 mg | INTRAMUSCULAR | Status: DC | PRN

## 2022-03-24 MED ORDER — POLYETHYLENE GLYCOL 3350 17 G PO PACK
17.0000 g | PACK | Freq: Every day | ORAL | Status: DC
  Administered 2022-03-24 – 2022-03-25 (×2): 17 g via ORAL
  Filled 2022-03-24: qty 1

## 2022-03-24 MED ORDER — OXYCODONE HCL 5 MG PO TABS
5.0000 mg | ORAL_TABLET | ORAL | Status: DC | PRN
  Administered 2022-03-24 – 2022-03-25 (×3): 5 mg via ORAL
  Filled 2022-03-24 (×4): qty 1

## 2022-03-24 NOTE — Hospital Course (Signed)
46 y.o. female with medical history significant of fibromyalgia, endometriosis.  Patient presented with abdominal pain.  CT scan raised concern for colonic intussusception. General surgery and gastroenterology were consulted.

## 2022-03-24 NOTE — Progress Notes (Signed)
  Progress Note   Patient: [name] MVH:[id number] DOB: 1976/01/23 DOA: 03/22/2022     2 DOS: the patient was seen and examined on 03/24/2022   Brief hospital course: 46 y.o. female with medical history significant of fibromyalgia, endometriosis.  Patient presented with abdominal pain.  CT scan raised concern for colonic intussusception. General surgery and gastroenterology were consulted.   Assessment and Plan: Intussusception This was thought to be colocolonic intussusception based on CT scan.  General surgery and gastroenterology consulted.  Patient is feeling better this morning after she has passed flatus. -Underwent colonoscopy 10/3, reviewed with findings of congested, erythematous, inflamed, ulcerated thickened folds of transverse colon -Diet advanced per General Surgery. Seen by GI, clear for d/c per GI   Endometriosis Followed by gynecology.    Hypokalemia replaced   Normocytic anemia Mild drop in hemoglobin is likely dilutional.  Recheck labs tomorrow.   Essential hypertension She mentions that she takes lisinopril for same.  This can be resumed after her procedure this evening.   Chronic pain syndrome Secondary to endometriosis.  Followed by pain medication specialist.  She is on oxycodone and Robaxin.      Subjective: Reports feeling better. Eager to go home soon  Physical Exam: Vitals:   03/24/22 0400 03/24/22 0848 03/24/22 0948 03/24/22 1320  BP: 108/62 (!) 152/101 119/72 106/67  Pulse: 75 81 72 87  Resp:  19  18  Temp: 98.9 F (37.2 C) 98.9 F (37.2 C)  98.4 F (36.9 C)  TempSrc: Oral Oral  Oral  SpO2: 96% 100%  100%  Weight:      Height:       General exam: Awake, laying in bed, in nad Respiratory system: Normal respiratory effort, no wheezing Cardiovascular system: regular rate, s1, s2 Gastrointestinal system: Soft, nondistended, positive BS Central nervous system: CN2-12 grossly intact, strength intact Extremities: Perfused, no  clubbing Skin: Normal skin turgor, no notable skin lesions seen Psychiatry: Mood normal // no visual hallucinations   Data Reviewed:  Labs reviewed: Na 140, K 3.8, Cr 0.82   Family Communication: Pt in room, family not at bedside  Disposition: Status is: Inpatient Remains inpatient appropriate because: Severity of illness  Planned Discharge Destination: Home     Author: Marylu Lund, MD 03/24/2022 6:39 PM  For on call review [email or web address].

## 2022-03-24 NOTE — Progress Notes (Signed)
[facility] Gastroenterology Progress Note  [name] 46 y.o. [birth year]   Subjective: Patient was seen and examined laying in bed.  Patient notes abdominal pain improved since admission but similar to yesterday.  She was recently transitioned to soft diet.  Notes she tolerated it well.  She is having watery stools, no blood in stool.   Objective: Vital signs in last 24 hours: Vitals:   03/24/22 0848 03/24/22 0948  BP: (!) 152/101 119/72  Pulse: 81 72  Resp: 19   Temp: 98.9 F (37.2 C)   SpO2: 100%     Physical Exam:  General:  Alert, cooperative, no distress, appears stated age  Head:  Normocephalic, without obvious abnormality, atraumatic  Eyes:  Anicteric sclera, EOM's intact  Lungs:   Clear to auscultation bilaterally, respirations unlabored  Heart:  Regular rate and rhythm, S1, S2 normal  Abdomen:   Soft, epigastric tenderness, bowel sounds active all four quadrants,  no masses,   Extremities: Extremities normal, atraumatic, no  edema  Pulses: 2+ and symmetric    Lab Results: Recent Labs    03/23/22 0500 03/24/22 0653  NA 141 140  K 3.4* 3.8  CL 116* 108  CO2 22 26  GLUCOSE 94 123*  BUN 7 5*  CREATININE 0.60 0.82  CALCIUM 7.3* 8.6*  MG  --  1.8   Recent Labs    03/22/22 0846 03/23/22 0500  AST 17 16  ALT 16 11  ALKPHOS 43 31*  BILITOT 0.4 0.8  PROT 7.3 4.9*  ALBUMIN 3.8 2.5*   Recent Labs    03/23/22 0500 03/24/22 0653  WBC 8.2 8.1  HGB 11.1* 10.5*  HCT 34.3* 31.9*  MCV 95.0 95.5  PLT 424* 376   No results for input(s): "LABPROT", "INR" in the last 72 hours.    Assessment Abdominal pain, nausea, vomiting Abnormal CT scan Constipation   Patient with sudden onset nausea, vomiting, abdominal pain, abdominal pain and vomiting improved at this time with IV fluids, Zofran, Dilaudid.  Patient reports this happened several times prior.  CT scan with findings of short segment colo-colonic intussusception with suspected underlying mass.   Possible abdominal pain, nausea, vomiting secondary to intussusception.  Abdominal pain, nausea, vomiting may be worsened by significant constipation.  Patient is on Percocet 3 times daily.   Colonoscopy 10/3 Poor prep, inflamed mucosa of the transverse colon, tattooed and biopsied, internal and external hemorrhoids. Bx significant for ischemic colitis   CT abdomen pelvis with contrast 03/22/2022 1. Short-segment colo-colonic intussusception involving the transverse colon with suspected underlying mass lesion serving as a lead point. Recommend correlation with colonoscopy. 2. Moderate stool burden throughout the colon extending to the splenic flexure with decompressed colon beyond the splenic flexure.  Overall patient's pain improved significantly.   Plan: Biopsy from colonoscopy consistent with ischemic colitis, likely due to intussusception  Advance diet as tolerated. Continue Miralax 1-2 times daly as outpatient for constipation.  [facility] will sign off.   Arvella Nigh Sally Reimers PA-C 03/24/2022, 12:33 PM  Contact #  [phone]

## 2022-03-24 NOTE — TOC Initial Note (Signed)
Transition of Care [facility] - Initial/Assessment Note    Patient Details  Name: [name] MRN: [id number] Date of Birth: 09/25/1975  Transition of Care [facility]) CM/SW Contact:    Leeroy Cha, RN Phone Number: 03/24/2022, 11:01 AM  Clinical Narrative:                  Transition of Care [facility]) Screening Note   Patient Details  Name: [name] Date of Birth: February 13, 1976   Transition of Care [facility]) CM/SW Contact:    Leeroy Cha, RN Phone Number: 03/24/2022, 11:01 AM    Transition of Care Department [facility]) has reviewed patient and no TOC needs have been identified at this time. We will continue to monitor patient advancement through interdisciplinary progression rounds. If new patient transition needs arise, please place a TOC consult.    Expected Discharge Plan: Home/Self Care Barriers to Discharge: Continued Medical Work up   Patient Goals and CMS Choice Patient states their goals for this hospitalization and ongoing recovery are:: to go home      Expected Discharge Plan and Services Expected Discharge Plan: Home/Self Care   Discharge Planning Services: CM Consult   Living arrangements for the past 2 months: Single Family Home                                      Prior Living Arrangements/Services Living arrangements for the past 2 months: Single Family Home Lives with:: Spouse Patient language and need for interpreter reviewed:: Yes Do you feel safe going back to the place where you live?: Yes            Criminal Activity/Legal Involvement Pertinent to Current Situation/Hospitalization: No - Comment as needed  Activities of Daily Living Home Assistive Devices/Equipment: None ADL Screening (condition at time of admission) Patient's cognitive ability adequate to safely complete daily activities?: Yes Is the patient deaf or have difficulty hearing?: No Does the patient have difficulty seeing, even when wearing glasses/contacts?:  Yes Does the patient have difficulty concentrating, remembering, or making decisions?: No Patient able to express need for assistance with ADLs?: Yes Does the patient have difficulty dressing or bathing?: No Independently performs ADLs?: Yes (appropriate for developmental age) Does the patient have difficulty walking or climbing stairs?: No Weakness of Legs: None Weakness of Arms/Hands: None  Permission Sought/Granted                  Emotional Assessment Appearance:: Appears stated age Attitude/Demeanor/Rapport: Engaged Affect (typically observed): Calm Orientation: : Oriented to Self, Oriented to Place, Oriented to  Time, Oriented to Situation Alcohol / Substance Use: Not Applicable Psych Involvement: No (comment)  Admission diagnosis:  Intussusception ([place]) [K56.1] Abdominal pain, left lower quadrant [R10.32] Patient Active Problem List   Diagnosis Date Noted   Intussusception ([place]) 03/22/2022   Endometriosis 10/25/2012   PCP:  Aura Dials, PA-C Pharmacy:   [id number] [place], [id number]: [phone]:: [phone]    Social Determinants of Health (SDOH) Interventions    Readmission Risk Interventions   No data to display

## 2022-03-24 NOTE — Progress Notes (Signed)
[facility] Progress Note  1 Day Post-Op  Subjective: CC-  Still not quite back to baseline but continues to feel better. Abdominal pain improving. Tolerating clear liquids. Denies n/v. Passing a lot of flatus, no further BMs yesterday.  Objective: Vital signs in last 24 hours: Temp:  [97.4 F (36.3 C)-99.1 F (37.3 C)] 98.9 F (37.2 C) (10/04 0400) Pulse Rate:  [75-97] 75 (10/04 0400) Resp:  [12-19] 17 (10/03 2339) BP: (108-164)/(56-112) 152/101 (10/04 0848) SpO2:  [94 %-100 %] 96 % (10/04 0400)    Intake/Output from previous day: 10/03 0701 - 10/04 0700 In: 797.1 [P.O.:240; I.V.:557.1] Out: -  Intake/Output this shift: No intake/output data recorded.  PE: Gen:  Alert, NAD, pleasant Pulm: rate and effort normal Abd: soft, ND, nontender  Lab Results:  Recent Labs    03/23/22 0500 03/24/22 0653  WBC 8.2 8.1  HGB 11.1* 10.5*  HCT 34.3* 31.9*  PLT 424* 376   BMET Recent Labs    03/22/22 0846 03/23/22 0500  NA 139 141  K 4.1 3.4*  CL 109 116*  CO2 23 22  GLUCOSE 117* 94  BUN 12 7  CREATININE 0.95 0.60  CALCIUM 9.1 7.3*   PT/INR No results for input(s): "LABPROT", "INR" in the last 72 hours. CMP     Component Value Date/Time   NA 141 03/23/2022 0500   K 3.4 (L) 03/23/2022 0500   CL 116 (H) 03/23/2022 0500   CO2 22 03/23/2022 0500   GLUCOSE 94 03/23/2022 0500   BUN 7 03/23/2022 0500   CREATININE 0.60 03/23/2022 0500   CALCIUM 7.3 (L) 03/23/2022 0500   PROT 4.9 (L) 03/23/2022 0500   ALBUMIN 2.5 (L) 03/23/2022 0500   AST 16 03/23/2022 0500   ALT 11 03/23/2022 0500   ALKPHOS 31 (L) 03/23/2022 0500   BILITOT 0.8 03/23/2022 0500   GFRNONAA >60 03/23/2022 0500   GFRAA >90 07/02/2013 1136   Lipase     Component Value Date/Time   LIPASE 28 03/22/2022 0846       Studies/Results: CT Abdomen Pelvis W Contrast  Result Date: 03/22/2022 CLINICAL DATA:  Nausea, vomiting EXAM: CT ABDOMEN AND PELVIS WITH CONTRAST TECHNIQUE: Multidetector  CT imaging of the abdomen and pelvis was performed using the standard protocol following bolus administration of intravenous contrast. RADIATION DOSE REDUCTION: This exam was performed according to the departmental dose-optimization program which includes automated exposure control, adjustment of the mA and/or kV according to patient size and/or use of iterative reconstruction technique. CONTRAST:  113m OMNIPAQUE IOHEXOL 300 MG/ML  SOLN COMPARISON:  CT abdomen/pelvis 07/02/2013 FINDINGS: Lower chest: The lung bases are clear. The imaged heart is unremarkable. Hepatobiliary: The liver and gallbladder are unremarkable. There is no biliary ductal dilatation. Pancreas: Unremarkable. Spleen: Unremarkable. Adrenals/Urinary Tract: Adrenals are unremarkable. The kidneys are unremarkable, with no focal lesion, stone, hydronephrosis, or hydroureter. Stomach/Bowel: The stomach is unremarkable. There is a moderate stool burden throughout the colon. There is short-segment colo-colonic intussusception involving the transverse colon with a suspected underlying mass lesion serving as a lead point (2-56, 5-52). The colon beyond the splenic flexure is decompressed. Vascular/Lymphatic: The abdominal aorta is normal in course and caliber. The major branch vessels are patent. The main portal and splenic veins are patent. There is no abdominopelvic lymphadenopathy. Reproductive: The uterus and adnexa are unremarkable. Other: There is no ascites or free air. Musculoskeletal: There is no acute osseous abnormality or suspicious osseous lesion. IMPRESSION: 1. Short-segment colo-colonic intussusception involving the transverse colon with  suspected underlying mass lesion serving as a lead point. Recommend correlation with colonoscopy. 2. Moderate stool burden throughout the colon extending to the splenic flexure with decompressed colon beyond the splenic flexure. Electronically Signed   By: Valetta Mole M.D.   On: 03/22/2022 10:56   DG  Chest Port 1 View  Result Date: 03/22/2022 CLINICAL DATA:  Left lower quadrant abdominal pain. EXAM: PORTABLE CHEST 1 VIEW COMPARISON:  06/09/2007 FINDINGS: Grossly unchanged cardiac silhouette and mediastinal contours given AP projection and portable technique. No focal airspace opacities. No pleural effusion or pneumothorax. No evidence of edema. No acute osseous abnormalities. IMPRESSION: No acute cardiopulmonary disease. Electronically Signed   By: Sandi Mariscal M.D.   On: 03/22/2022 09:23    Anti-infectives: Anti-infectives (From admission, onward)    None        Assessment/Plan Left sided abdominal pain, nausea, vomiting ?Colonic intussusception involving the transverse colon Constipation - CT abdomen/pelvis reports short-segment colo-colonic intussusception involving the transverse colon with suspected underlying mass lesion serving as a lead point; moderate stool burden throughout the colon extending to the splenic flexure with decompressed colon beyond the splenic flexure - s/p colonoscopy 10/3: a localized area of moderately congested, erythematous, inflamed, ulcerated and thickened folds of the mucosa in the transverse colon, 55 cm from insertion (likely site of intussusception noted on CT). Biopsies were taken with a cold forceps for histology. Area was tattooed with an injection of 4 mL of Spot (carbon black). Sounds like no active intussusception and no mass noted. - Continues to clinically improve. No indication for surgery. Advance to soft diet. Add daily miralax. Follow up biopsy results. Possible discharge later today vs tomorrow if she tolerates diet advancement and continues to feel better.    ID - none VTE - ok for chemical dvt ppx from surgical standpoint FEN - IVF, soft diet Foley - none   Endometriosis s/p multiple prior surgeries Fibromyalgia Chronic pelvic pain on daily oxycodone '5mg'$  TID and muscle relaxers   I reviewed consult GI notes, last 24 h vitals and  pain scores, last 48 h intake and output, last 24 h labs and trends, and last 24 h imaging results.    LOS: 2 days    [facility] 03/24/2022, 9:18 AM Please see Amion for pager number during day hours 7:00am-4:30pm

## 2022-03-24 NOTE — Progress Notes (Signed)
Mobility Specialist - Progress Note   03/24/22 1344  Mobility  Activity Ambulated with assistance in hallway  Activity Response Tolerated well  Distance Ambulated (ft) 1500 ft  $Mobility charge 1 Mobility  Level of Assistance Independent  Assistive Device None   Pt received in bed and agreed for mobility, no c/o pain nor discomfort. Pt back to bed with all needs met.   Roderick Pee Mobility Specialist

## 2022-03-25 LAB — COMPREHENSIVE METABOLIC PANEL
ALT: 28 U/L (ref 0–44)
AST: 29 U/L (ref 15–41)
Albumin: 2.9 g/dL — ABNORMAL LOW (ref 3.5–5.0)
Alkaline Phosphatase: 43 U/L (ref 38–126)
Anion gap: 6 (ref 5–15)
BUN: 7 mg/dL (ref 6–20)
CO2: 26 mmol/L (ref 22–32)
Calcium: 8.7 mg/dL — ABNORMAL LOW (ref 8.9–10.3)
Chloride: 109 mmol/L (ref 98–111)
Creatinine, Ser: 0.85 mg/dL (ref 0.44–1.00)
GFR, Estimated: >60 mL/min (ref >60)
Glucose, Bld: 111 mg/dL — ABNORMAL HIGH (ref 70–99)
Potassium: 3.7 mmol/L (ref 3.5–5.1)
Sodium: 141 mmol/L (ref 135–145)
Total Bilirubin: 0.4 mg/dL (ref 0.3–1.2)
Total Protein: 5.6 g/dL — ABNORMAL LOW (ref 6.5–8.1)

## 2022-03-25 LAB — CBC
HCT: 33.2 % — ABNORMAL LOW (ref 36.0–46.0)
Hemoglobin: 10.8 g/dL — ABNORMAL LOW (ref 12.0–15.0)
MCH: 30.8 pg (ref 26.0–34.0)
MCHC: 32.5 g/dL (ref 30.0–36.0)
MCV: 94.6 fL (ref 80.0–100.0)
Platelets: 388 10*3/uL (ref 150–400)
RBC: 3.51 MIL/uL — ABNORMAL LOW (ref 3.87–5.11)
RDW: 12.3 % (ref 11.5–15.5)
WBC: 5.5 10*3/uL (ref 4.0–10.5)
nRBC: 0 % (ref 0.0–0.2)

## 2022-03-25 NOTE — TOC Transition Note (Signed)
Transition of Care [facility]) - CM/SW Discharge Note   Patient Details  Name: [name] MRN: [id number] Date of Birth: 1975-12-08  Transition of Care [facility]) CM/SW Contact:  Leeroy Cha, RN Phone Number: 03/25/2022, 12:15 PM   Clinical Narrative:    Pt dcd to home with self care. No toc needs present.   Final next level of care: Home/Self Care Barriers to Discharge: Barriers Resolved   Patient Goals and CMS Choice Patient states their goals for this hospitalization and ongoing recovery are:: to go home      Discharge Placement                       Discharge Plan and Services   Discharge Planning Services: CM Consult                                 Social Determinants of Health (SDOH) Interventions     Readmission Risk Interventions   No data to display

## 2022-03-25 NOTE — Progress Notes (Signed)
[facility] Progress Note  2 Days Post-Op  Subjective: CC-  Feeling well and ready to go home  Objective: Vital signs in last 24 hours: Temp:  [98.4 F (36.9 C)-99.6 F (37.6 C)] 98.7 F (37.1 C) (10/05 0421) Pulse Rate:  [72-87] 87 (10/05 0421) Resp:  [18-19] 18 (10/05 0421) BP: (106-157)/(67-101) 127/89 (10/05 0421) SpO2:  [100 %] 100 % (10/05 0421) Last BM Date : 03/24/22  Intake/Output from previous day: 10/04 0701 - 10/05 0700 In: 480 [P.O.:480] Out: -  Intake/Output this shift: No intake/output data recorded.  PE: Gen:  Alert, NAD, pleasant Pulm: rate and effort normal Abd: soft, ND, nontender  Lab Results:  Recent Labs    03/24/22 0653 03/25/22 0631  WBC 8.1 5.5  HGB 10.5* 10.8*  HCT 31.9* 33.2*  PLT 376 388    BMET Recent Labs    03/24/22 0653 03/25/22 0631  NA 140 141  K 3.8 3.7  CL 108 109  CO2 26 26  GLUCOSE 123* 111*  BUN 5* 7  CREATININE 0.82 0.85  CALCIUM 8.6* 8.7*    PT/INR No results for input(s): "LABPROT", "INR" in the last 72 hours. CMP     Component Value Date/Time   NA 141 03/25/2022 0631   K 3.7 03/25/2022 0631   CL 109 03/25/2022 0631   CO2 26 03/25/2022 0631   GLUCOSE 111 (H) 03/25/2022 0631   BUN 7 03/25/2022 0631   CREATININE 0.85 03/25/2022 0631   CALCIUM 8.7 (L) 03/25/2022 0631   PROT 5.6 (L) 03/25/2022 0631   ALBUMIN 2.9 (L) 03/25/2022 0631   AST 29 03/25/2022 0631   ALT 28 03/25/2022 0631   ALKPHOS 43 03/25/2022 0631   BILITOT 0.4 03/25/2022 0631   GFRNONAA >60 03/25/2022 0631   GFRAA >90 07/02/2013 1136   Lipase     Component Value Date/Time   LIPASE 28 03/22/2022 0846       Studies/Results: No results found.  Anti-infectives: Anti-infectives (From admission, onward)    None        Assessment/Plan Left sided abdominal pain, nausea, vomiting ?Colonic intussusception involving the transverse colon Constipation - CT abdomen/pelvis reports short-segment colo-colonic  intussusception involving the transverse colon with suspected underlying mass lesion serving as a lead point; moderate stool burden throughout the colon extending to the splenic flexure with decompressed colon beyond the splenic flexure - s/p colonoscopy 10/3: a localized area of moderately congested, erythematous, inflamed, ulcerated and thickened folds of the mucosa in the transverse colon, 55 cm from insertion (likely site of intussusception noted on CT). Biopsies were taken with a cold forceps for histology. Area was tattooed with an injection of 4 mL of Spot (carbon black). Sounds like no active intussusception and no mass noted. Path with ischemic colitis likely secondary to transient intussusception - Case discussed with colorectal colleagues. No further surgical follow up necessary, recommend GI follow up for repeat colonoscopy in about 6 months.   OK for discharge from surgery standpoint   ID - none VTE - ok for chemical dvt ppx from surgical standpoint FEN - IVF, soft diet Foley - none   Endometriosis s/p multiple prior surgeries Fibromyalgia Chronic pelvic pain on daily oxycodone '5mg'$  TID and muscle relaxers   I reviewed consult GI notes, last 24 h vitals and pain scores, last 48 h intake and output, last 24 h labs and trends, and last 24 h imaging results.    LOS: 3 days    Clovis Riley, MD [facility] 03/25/2022, 8:32 AM Please see Amion for pager number during day hours 7:00am-4:30pm

## 2022-03-25 NOTE — Discharge Summary (Signed)
Physician Discharge Summary   Patient: [name] MRN: [id number] DOB: Mar 04, 1976  Admit date:     03/22/2022  Discharge date: 03/25/22  Discharge Physician: Marylu Lund   PCP: Aura Dials, PA-C   Recommendations at discharge:    Follow up with PCP in 2-3 weeks Recommendation to f/u with GI in 6 months for follow up Colonoscopy  Discharge Diagnoses: Principal Problem:   Intussusception [facility]) Active Problems:   Endometriosis  Resolved Problems:   * No resolved hospital problems. *  Hospital Course: 46 y.o. female with medical history significant of fibromyalgia, endometriosis.  Patient presented with abdominal pain.  CT scan raised concern for colonic intussusception. General surgery and gastroenterology were consulted.   Assessment and Plan: Intussusception This was thought to be colocolonic intussusception based on CT scan.  General surgery and gastroenterology consulted.  Patient is feeling better this morning after she has passed flatus. -Underwent colonoscopy 10/3, reviewed with findings of congested, erythematous, inflamed, ulcerated thickened folds of transverse colon -Diet advanced per General Surgery. Seen by GI, clear for d/c per GI. Also clear for d/c per General Surgery -Surgery recs for f/u colonosocpy in 6 mos   Endometriosis Followed by gynecology.    Hypokalemia replaced   Normocytic anemia Remained hemodynamically stable   Essential hypertension Cont home meds on d/c   Chronic pain syndrome Secondary to endometriosis.  Followed by pain medication specialist.  She is on oxycodone and Robaxin.       Consultants: GI, General Surgery Procedures performed: EGD  Disposition: Home Diet recommendation:  Regular diet DISCHARGE MEDICATION: Allergies as of 03/25/2022       Reactions   Compazine [prochlorperazine] Rash   Latex Hives   Morphine And Related Hives   Reglan [metoclopramide] Anxiety        Medication List     STOP taking  these medications    amitriptyline 25 MG tablet Commonly known as: ELAVIL   oxyCODONE-acetaminophen 10-325 MG tablet Commonly known as: Percocet       TAKE these medications    acetaminophen 500 MG tablet Commonly known as: TYLENOL Take 500 mg by mouth every 6 (six) hours as needed for mild pain.   Azelastine-Fluticasone 137-50 MCG/ACT Susp Place 1 spray into both nostrils 2 (two) times daily.   DULoxetine 60 MG capsule Commonly known as: CYMBALTA Take 60 mg by mouth daily.   ibuprofen 200 MG tablet Commonly known as: ADVIL Take 200 mg by mouth every 6 (six) hours as needed for mild pain.   lisinopril 10 MG tablet Commonly known as: ZESTRIL Take 10 mg by mouth daily.   methocarbamol 500 MG tablet Commonly known as: ROBAXIN Take 1,000 mg by mouth 2 (two) times daily as needed for muscle spasms.   multivitamin with minerals Tabs tablet Take 1 tablet by mouth daily.   norethindrone 0.35 MG tablet Commonly known as: MICRONOR Take 1 tablet by mouth daily.   oxyCODONE 5 MG immediate release tablet Commonly known as: Oxy IR/ROXICODONE Take 5 mg by mouth every 8 (eight) hours as needed for moderate pain.   oxymetazoline 0.05 % nasal spray Commonly known as: AFRIN Place 1 spray into both nostrils 2 (two) times daily as needed for congestion.   promethazine 12.5 MG tablet Commonly known as: PHENERGAN TAKE 2 TABLETS BY MOUTH EVERY 8 HOURS AS NEEDED. What changed:  how much to take how to take this when to take this reasons to take this additional instructions   SUDAFED 24 HOUR PO Take  1 tablet by mouth daily as needed (for congestion).   tiZANidine 4 MG tablet Commonly known as: ZANAFLEX Take 4 mg by mouth every 8 (eight) hours as needed for muscle spasms.   traZODone 50 MG tablet Commonly known as: DESYREL Take 200 mg by mouth at bedtime.   zolpidem 10 MG tablet Commonly known as: AMBIEN TAKE 1 TABLET BY MOUTH AT BEDTIME AS NEEDED What changed: reasons  to take this        Follow-up Information     Aura Dials, PA-C Follow up in 2 week(s).   Specialty: Physician Assistant Why: Hospital follow up Contact information: [place] [address] [phone]         Gastroenterology, [facility] Follow up.   Why: As needed Contact information: [place] [address] [phone]                Discharge Exam: Danley Danker Weights   03/22/22 0842  Weight: 69.4 kg   General exam: Awake, laying in bed, in nad Respiratory system: Normal respiratory effort, no wheezing Cardiovascular system: regular rate, s1, s2 Gastrointestinal system: Soft, nondistended, positive BS Central nervous system: CN2-12 grossly intact, strength intact Extremities: Perfused, no clubbing Skin: Normal skin turgor, no notable skin lesions seen Psychiatry: Mood normal // no visual hallucinations   Condition at discharge: fair  The results of significant diagnostics from this hospitalization (including imaging, microbiology, ancillary and laboratory) are listed below for reference.   Imaging Studies: CT Abdomen Pelvis W Contrast  Result Date: 03/22/2022 CLINICAL DATA:  Nausea, vomiting EXAM: CT ABDOMEN AND PELVIS WITH CONTRAST TECHNIQUE: Multidetector CT imaging of the abdomen and pelvis was performed using the standard protocol following bolus administration of intravenous contrast. RADIATION DOSE REDUCTION: This exam was performed according to the departmental dose-optimization program which includes automated exposure control, adjustment of the mA and/or kV according to patient size and/or use of iterative reconstruction technique. CONTRAST:  173m OMNIPAQUE IOHEXOL 300 MG/ML  SOLN COMPARISON:  CT abdomen/pelvis 07/02/2013 FINDINGS: Lower chest: The lung bases are clear. The imaged heart is unremarkable. Hepatobiliary: The liver and gallbladder are unremarkable. There is no biliary ductal dilatation. Pancreas:  Unremarkable. Spleen: Unremarkable. Adrenals/Urinary Tract: Adrenals are unremarkable. The kidneys are unremarkable, with no focal lesion, stone, hydronephrosis, or hydroureter. Stomach/Bowel: The stomach is unremarkable. There is a moderate stool burden throughout the colon. There is short-segment colo-colonic intussusception involving the transverse colon with a suspected underlying mass lesion serving as a lead point (2-56, 5-52). The colon beyond the splenic flexure is decompressed. Vascular/Lymphatic: The abdominal aorta is normal in course and caliber. The major branch vessels are patent. The main portal and splenic veins are patent. There is no abdominopelvic lymphadenopathy. Reproductive: The uterus and adnexa are unremarkable. Other: There is no ascites or free air. Musculoskeletal: There is no acute osseous abnormality or suspicious osseous lesion. IMPRESSION: 1. Short-segment colo-colonic intussusception involving the transverse colon with suspected underlying mass lesion serving as a lead point. Recommend correlation with colonoscopy. 2. Moderate stool burden throughout the colon extending to the splenic flexure with decompressed colon beyond the splenic flexure. Electronically Signed   By: PValetta MoleM.D.   On: 03/22/2022 10:56   DG Chest Port 1 View  Result Date: 03/22/2022 CLINICAL DATA:  Left lower quadrant abdominal pain. EXAM: PORTABLE CHEST 1 VIEW COMPARISON:  06/09/2007 FINDINGS: Grossly unchanged cardiac silhouette and mediastinal contours given AP projection and portable technique. No focal airspace opacities. No pleural  effusion or pneumothorax. No evidence of edema. No acute osseous abnormalities. IMPRESSION: No acute cardiopulmonary disease. Electronically Signed   By: Sandi Mariscal M.D.   On: 03/22/2022 09:23    Microbiology: Results for orders placed or performed during the hospital encounter of 07/02/13  Wet prep, genital     Status: Abnormal   Collection Time: 07/02/13  1:00 PM    Specimen: Cervix; Genital  Result Value Ref Range Status   Yeast Wet Prep HPF POC NONE SEEN NONE SEEN Final   Trich, Wet Prep NONE SEEN NONE SEEN Final   Clue Cells Wet Prep HPF POC NONE SEEN NONE SEEN Final   WBC, Wet Prep HPF POC FEW (A) NONE SEEN Final    Comment: MODERATE BACTERIA SEEN  GC/Chlamydia Probe Amp (multiple spec sources)     Status: None   Collection Time: 07/02/13  1:00 PM   Specimen: Genital  Result Value Ref Range Status   CT Probe RNA NEGATIVE NEGATIVE Final   GC Probe RNA NEGATIVE NEGATIVE Final    Comment: (NOTE)                                                                                       **Normal Reference Range: Negative**      Assay performed using the Gen-Probe APTIMA COMBO2 (R) Assay. Acceptable specimen types for this assay include APTIMA Swabs (Unisex, endocervical, urethral, or vaginal), first void urine, and ThinPrep liquid based cytology samples. Performed at [facility]: CBC: Recent Labs  Lab 03/22/22 0846 03/23/22 0500 03/24/22 0653 03/25/22 0631  WBC 10.3 8.2 8.1 5.5  HGB 12.9 11.1* 10.5* 10.8*  HCT 38.8 34.3* 31.9* 33.2*  MCV 92.8 95.0 95.5 94.6  PLT 480* 424* 376 588   Basic Metabolic Panel: Recent Labs  Lab 03/22/22 0846 03/23/22 0500 03/24/22 0653 03/25/22 0631  NA 139 141 140 141  K 4.1 3.4* 3.8 3.7  CL 109 116* 108 109  CO2 '23 22 26 26  '$ GLUCOSE 117* 94 123* 111*  BUN 12 7 5* 7  CREATININE 0.95 0.60 0.82 0.85  CALCIUM 9.1 7.3* 8.6* 8.7*  MG  --   --  1.8  --    Liver Function Tests: Recent Labs  Lab 03/22/22 0846 03/23/22 0500 03/25/22 0631  AST '17 16 29  '$ ALT '16 11 28  '$ ALKPHOS 43 31* 43  BILITOT 0.4 0.8 0.4  PROT 7.3 4.9* 5.6*  ALBUMIN 3.8 2.5* 2.9*   CBG: No results for input(s): "GLUCAP" in the last 168 hours.  Discharge time spent: less than 30 minutes.  Signed: Marylu Lund, MD [facility] 03/25/2022

## 2022-03-28 ENCOUNTER — Encounter (HOSPITAL_COMMUNITY): Payer: Self-pay | Admitting: Gastroenterology

## 2022-04-08 ENCOUNTER — Other Ambulatory Visit (HOSPITAL_BASED_OUTPATIENT_CLINIC_OR_DEPARTMENT_OTHER): Payer: Self-pay | Admitting: Physician Assistant

## 2022-04-08 DIAGNOSIS — Z1231 Encounter for screening mammogram for malignant neoplasm of breast: Secondary | ICD-10-CM

## 2022-04-13 ENCOUNTER — Encounter (HOSPITAL_BASED_OUTPATIENT_CLINIC_OR_DEPARTMENT_OTHER): Payer: Self-pay | Admitting: Radiology

## 2022-04-13 ENCOUNTER — Ambulatory Visit (HOSPITAL_BASED_OUTPATIENT_CLINIC_OR_DEPARTMENT_OTHER)
Admission: RE | Admit: 2022-04-13 | Discharge: 2022-04-13 | Disposition: A | Discharge status: Home / Self Care | Payer: Self-pay | Source: Ambulatory Visit | Attending: Physician Assistant | Admitting: Physician Assistant

## 2022-04-13 DIAGNOSIS — Z1231 Encounter for screening mammogram for malignant neoplasm of breast: Secondary | ICD-10-CM | POA: Insufficient documentation
